# Patient Record
Sex: Male | Born: 1985 | Hispanic: Yes | Marital: Married | Smoking: Former smoker
Health system: Southern US, Community
[De-identification: ages and names within clinical notes are randomized; demographics above are authoritative.]

## PROBLEM LIST (undated history)

## (undated) DIAGNOSIS — N183 Chronic kidney disease, stage 3 unspecified: Secondary | ICD-10-CM

## (undated) DIAGNOSIS — I10 Essential (primary) hypertension: Secondary | ICD-10-CM

## (undated) HISTORY — DX: Essential (primary) hypertension: I10

## (undated) HISTORY — DX: Chronic kidney disease, stage 3 (moderate): N18.3

## (undated) HISTORY — PX: NO PAST SURGERIES: SHX2092

## (undated) HISTORY — DX: Chronic kidney disease, stage 3 unspecified: N18.30

---

## 2016-03-23 DIAGNOSIS — R1031 Right lower quadrant pain: Secondary | ICD-10-CM | POA: Diagnosis not present

## 2016-03-23 DIAGNOSIS — R040 Epistaxis: Secondary | ICD-10-CM | POA: Diagnosis not present

## 2016-03-23 DIAGNOSIS — I10 Essential (primary) hypertension: Secondary | ICD-10-CM | POA: Diagnosis not present

## 2016-03-30 DIAGNOSIS — R040 Epistaxis: Secondary | ICD-10-CM | POA: Diagnosis not present

## 2016-05-18 DIAGNOSIS — J320 Chronic maxillary sinusitis: Secondary | ICD-10-CM | POA: Diagnosis not present

## 2016-05-18 DIAGNOSIS — I10 Essential (primary) hypertension: Secondary | ICD-10-CM | POA: Diagnosis not present

## 2016-10-24 DIAGNOSIS — I10 Essential (primary) hypertension: Secondary | ICD-10-CM | POA: Diagnosis not present

## 2016-10-24 DIAGNOSIS — J069 Acute upper respiratory infection, unspecified: Secondary | ICD-10-CM | POA: Diagnosis not present

## 2016-10-24 DIAGNOSIS — R6889 Other general symptoms and signs: Secondary | ICD-10-CM | POA: Diagnosis not present

## 2016-10-25 DIAGNOSIS — M9905 Segmental and somatic dysfunction of pelvic region: Secondary | ICD-10-CM | POA: Diagnosis not present

## 2016-10-25 DIAGNOSIS — M9903 Segmental and somatic dysfunction of lumbar region: Secondary | ICD-10-CM | POA: Diagnosis not present

## 2016-10-25 DIAGNOSIS — M9902 Segmental and somatic dysfunction of thoracic region: Secondary | ICD-10-CM | POA: Diagnosis not present

## 2016-10-25 DIAGNOSIS — M5137 Other intervertebral disc degeneration, lumbosacral region: Secondary | ICD-10-CM | POA: Diagnosis not present

## 2016-10-29 DIAGNOSIS — M9905 Segmental and somatic dysfunction of pelvic region: Secondary | ICD-10-CM | POA: Diagnosis not present

## 2016-10-29 DIAGNOSIS — M9902 Segmental and somatic dysfunction of thoracic region: Secondary | ICD-10-CM | POA: Diagnosis not present

## 2016-10-29 DIAGNOSIS — M9903 Segmental and somatic dysfunction of lumbar region: Secondary | ICD-10-CM | POA: Diagnosis not present

## 2016-10-29 DIAGNOSIS — M5137 Other intervertebral disc degeneration, lumbosacral region: Secondary | ICD-10-CM | POA: Diagnosis not present

## 2016-10-30 DIAGNOSIS — M5137 Other intervertebral disc degeneration, lumbosacral region: Secondary | ICD-10-CM | POA: Diagnosis not present

## 2016-10-30 DIAGNOSIS — M9903 Segmental and somatic dysfunction of lumbar region: Secondary | ICD-10-CM | POA: Diagnosis not present

## 2016-10-30 DIAGNOSIS — M9905 Segmental and somatic dysfunction of pelvic region: Secondary | ICD-10-CM | POA: Diagnosis not present

## 2016-10-30 DIAGNOSIS — M9902 Segmental and somatic dysfunction of thoracic region: Secondary | ICD-10-CM | POA: Diagnosis not present

## 2016-10-31 DIAGNOSIS — M9902 Segmental and somatic dysfunction of thoracic region: Secondary | ICD-10-CM | POA: Diagnosis not present

## 2016-10-31 DIAGNOSIS — M9905 Segmental and somatic dysfunction of pelvic region: Secondary | ICD-10-CM | POA: Diagnosis not present

## 2016-10-31 DIAGNOSIS — M5137 Other intervertebral disc degeneration, lumbosacral region: Secondary | ICD-10-CM | POA: Diagnosis not present

## 2016-10-31 DIAGNOSIS — M9903 Segmental and somatic dysfunction of lumbar region: Secondary | ICD-10-CM | POA: Diagnosis not present

## 2016-11-02 DIAGNOSIS — J209 Acute bronchitis, unspecified: Secondary | ICD-10-CM | POA: Diagnosis not present

## 2016-11-05 DIAGNOSIS — M9905 Segmental and somatic dysfunction of pelvic region: Secondary | ICD-10-CM | POA: Diagnosis not present

## 2016-11-05 DIAGNOSIS — M5137 Other intervertebral disc degeneration, lumbosacral region: Secondary | ICD-10-CM | POA: Diagnosis not present

## 2016-11-05 DIAGNOSIS — M9902 Segmental and somatic dysfunction of thoracic region: Secondary | ICD-10-CM | POA: Diagnosis not present

## 2016-11-05 DIAGNOSIS — M9903 Segmental and somatic dysfunction of lumbar region: Secondary | ICD-10-CM | POA: Diagnosis not present

## 2016-11-14 DIAGNOSIS — J209 Acute bronchitis, unspecified: Secondary | ICD-10-CM | POA: Diagnosis not present

## 2017-10-21 ENCOUNTER — Ambulatory Visit (INDEPENDENT_AMBULATORY_CARE_PROVIDER_SITE_OTHER): Payer: BLUE CROSS/BLUE SHIELD | Admitting: Family Medicine

## 2017-10-21 ENCOUNTER — Encounter: Payer: Self-pay | Admitting: Family Medicine

## 2017-10-21 VITALS — BP 157/95 | HR 72 | Temp 98.4°F | Resp 18 | Ht 65.0 in | Wt 200.0 lb

## 2017-10-21 DIAGNOSIS — I10 Essential (primary) hypertension: Secondary | ICD-10-CM

## 2017-10-21 DIAGNOSIS — R0683 Snoring: Secondary | ICD-10-CM

## 2017-10-21 MED ORDER — CHLORTHALIDONE 25 MG PO TABS: 25.0000 mg | ORAL_TABLET | Freq: Every day | ORAL | 2 refills | Status: DC

## 2017-10-21 MED FILL — CHLORTHALIDONE 25 MG TAB: 25 | 37 days supply | Qty: 30 | Fill #0

## 2017-10-21 NOTE — Progress Notes (Signed)
Chief Complaint  Patient presents with  . Establish Care       New Patient Visit SUBJECTIVE: HPI: William Long is an 32 y.o.male who is being seen for establishing care.  The patient has not previously had a PCP.   Hypertension Patient presents for hypertension follow up. He does monitor home blood pressures. Blood pressures ranging on average from 130-150's/80-90's. He is not compliant with medications- amlodipine 5 mg/d, HCTZ 25 mg/d. Patient has these side effects of medication: amlodipine may have made him sleepy He is not adhering to a healthy diet overall. Exercise: None; physically active at home +famhx of HTN; unsure about parents, uncle and cousin have it Pt does snore and wakes up feeling poorly rested. Concerned he may have sleep apnea.   Past Medical History:  Diagnosis Date  . Hypertension    History reviewed. No pertinent surgical history. Family History  Problem Relation Age of Onset  . Diabetes Maternal Grandfather    Not on File  Current Outpatient Medications:  .  chlorthalidone (HYGROTON) 25 MG tablet, Take 1 tablet (25 mg total) by mouth daily. Take 1/2 tab daily for the first 2 weeks., Disp: 30 tablet, Rfl: 2  ROS Cardiovascular: Denies chest pain  Respiratory: Denies dyspnea   OBJECTIVE: BP (!) 157/95 (BP Location: Left Arm, Patient Position: Sitting, Cuff Size: Normal)   Pulse 72   Temp 98.4 F (36.9 C) (Oral)   Resp 18   Ht 5\' 5"  (1.651 m)   Wt 200 lb (90.7 kg)   SpO2 100%   BMI 33.28 kg/m   Constitutional: -  VS reviewed -  Well developed, well nourished, appears stated age -  No apparent distress  Psychiatric: -  Oriented to person, place, and time -  Memory intact -  Affect and mood normal -  Fluent conversation, good eye contact -  Judgment and insight age appropriate  Eye: -  Conjunctivae clear, no discharge -  Pupils symmetric, round, reactive to light  ENMT: -  MMM    Pharynx moist, no exudate, no erythema  Neck: -   No gross swelling, no palpable masses -  Thyroid midline, not enlarged, mobile, no palpable masses  Cardiovascular: -  RRR -  No LE edema  Respiratory: -  Normal respiratory effort, no accessory muscle use, no retraction -  Breath sounds equal, no wheezes, no ronchi, no crackles  Musculoskeletal: -  No clubbing, no cyanosis -  Gait normal  Skin: -  No significant lesion on inspection -  Warm and dry to palpation   ASSESSMENT/PLAN: Snoring - Plan: Ambulatory referral to Pulmonology  Essential hypertension - Plan: Basic metabolic panel, Basic metabolic panel  OSA would be rational explanation for his BP at this age. Will set up with sleep team. Counseled on diet and exercise. Healthy diet handout given. Stop amlodipine as it sounds like it gave him more drowsiness. Start chlorthalidone 12.5 mg/d and increase to 25 mg/d after 2 weeks. Check lytes/renal function today and in 1 week after starting. Ck BP at home, write down and bring to next appt. Patient should return in 1 mo for CPE. The patient voiced understanding and agreement to the plan.   [place], DO 10/21/17  2:54 PM

## 2017-10-21 NOTE — Patient Instructions (Addendum)
If you do not hear anything about your referral in the next 1-2 weeks, call our office and ask for an update.  Aim to do some physical exertion for 150 minutes per week. This is typically divided into 5 days per week, 30 minutes per day. The activity should be enough to get your heart rate up. Anything is better than nothing if you have time constraints.  Healthy Eating Plan Many factors influence your heart health, including eating and exercise habits. Heart (coronary) risk increases with abnormal blood fat (lipid) levels. Heart-healthy meal planning includes limiting unhealthy fats, increasing healthy fats, and making other small dietary changes. This includes maintaining a healthy body weight to help keep lipid levels within a normal range.  WHAT IS MY PLAN?  Your health care provider recommends that you:  Drink a glass of water before meals to help with satiety.  Eat slowly.  An alternative to the water is to add Metamucil. This will help with satiety as well. It does contain calories, unlike water.  WHAT TYPES OF FAT SHOULD I CHOOSE?  Choose healthy fats more often. Choose monounsaturated and polyunsaturated fats, such as olive oil and canola oil, flaxseeds, walnuts, almonds, and seeds.  Eat more omega-3 fats. Good choices include salmon, mackerel, sardines, tuna, flaxseed oil, and ground flaxseeds. Aim to eat fish at least two times each week.  Avoid foods with partially hydrogenated oils in them. These contain trans fats. Examples of foods that contain trans fats are stick margarine, some tub margarines, cookies, crackers, and other baked goods. If you are going to avoid a fat, this is the one to avoid!  WHAT GENERAL GUIDELINES DO I NEED TO FOLLOW?  Check food labels carefully to identify foods with trans fats. Avoid these types of options when possible.  Fill one half of your plate with vegetables and green salads. Eat 4-5 servings of vegetables per day. A serving of vegetables  equals 1 cup of raw leafy vegetables,  cup of raw or cooked cut-up vegetables, or  cup of vegetable juice.  Fill one fourth of your plate with whole grains. Look for the word "whole" as the first word in the ingredient list.  Fill one fourth of your plate with lean protein foods.  Eat 4-5 servings of fruit per day. A serving of fruit equals one medium whole fruit,  cup of dried fruit,  cup of fresh, frozen, or canned fruit. Try to avoid fruits in cups/syrups as the sugar content can be high.  Eat more foods that contain soluble fiber. Examples of foods that contain this type of fiber are apples, broccoli, carrots, beans, peas, and barley. Aim to get 20-30 g of fiber per day.  Eat more home-cooked food and less restaurant, buffet, and fast food.  Limit or avoid alcohol.  Limit foods that are high in starch and sugar.  Avoid fried foods when able.  Cook foods by using methods other than frying. Baking, boiling, grilling, and broiling are all great options. Other fat-reducing suggestions include: ? Removing the skin from poultry. ? Removing all visible fats from meats. ? Skimming the fat off of stews, soups, and gravies before serving them. ? Steaming vegetables in water or broth.  Lose weight if you are overweight. Losing just 5-10% of your initial body weight can help your overall health and prevent diseases such as diabetes and heart disease.  Increase your consumption of nuts, legumes, and seeds to 4-5 servings per week. One serving of dried beans or  legumes equals  cup after being cooked, one serving of nuts equals 1 ounces, and one serving of seeds equals  ounce or 1 tablespoon.  WHAT ARE GOOD FOODS CAN I EAT? Grains Grainy breads (try to find bread that is 3 g of fiber per slice or greater), oatmeal, light popcorn. Whole-grain cereals. Rice and pasta, including brown rice and those that are made with whole wheat. Edamame pasta is a great alternative to grain pasta. It has a  higher protein content. Try to avoid significant consumption of white bread, sugary cereals, or pastries/baked goods.  Vegetables All vegetables. Cooked white potatoes do not count as vegetables.  Fruits All fruits, but limit pineapple and bananas as these fruits have a higher sugar content.  Meats and Other Protein Sources Lean, well-trimmed beef, veal, pork, and lamb. Chicken and [place] without skin. All fish and shellfish. Wild duck, rabbit, pheasant, and venison. Egg whites or low-cholesterol egg substitutes. Dried beans, peas, lentils, and tofu.Seeds and most nuts.  Dairy Low-fat or nonfat cheeses, including ricotta, string, and mozzarella. Skim or 1% milk that is liquid, powdered, or evaporated. Buttermilk that is made with low-fat milk. Nonfat or low-fat yogurt. Soy/Almond milk are good alternatives if you cannot handle dairy.  Beverages Water is the best for you. Sports drinks with less sugar are more desirable unless you are a highly active athlete.  Sweets and Desserts Sherbets and fruit ices. Honey, jam, marmalade, jelly, and syrups. Dark chocolate.  Eat all sweets and desserts in moderation.  Fats and Oils Nonhydrogenated (trans-free) margarines. Vegetable oils, including soybean, sesame, sunflower, olive, peanut, safflower, corn, canola, and cottonseed. Salad dressings or mayonnaise that are made with a vegetable oil. Limit added fats and oils that you use for cooking, baking, salads, and as spreads.  Other Cocoa powder. Coffee and tea. Most condiments.  The items listed above may not be a complete list of recommended foods or beverages. Contact your dietitian for more options.

## 2017-10-22 ENCOUNTER — Other Ambulatory Visit: Payer: Self-pay | Admitting: Family Medicine

## 2017-10-22 LAB — BASIC METABOLIC PANEL
BUN: 16 mg/dL (ref 6–23)
CO2: 28 mEq/L (ref 19–32)
Calcium: 9 mg/dL (ref 8.4–10.5)
Chloride: 103 mEq/L (ref 96–112)
Creatinine, Ser: 1.9 mg/dL — ABNORMAL HIGH (ref 0.40–1.50)
GFR: 44.19 mL/min — ABNORMAL LOW (ref >60.00)
Glucose, Bld: 100 mg/dL — ABNORMAL HIGH (ref 70–99)
Potassium: 3.9 mEq/L (ref 3.5–5.1)
Sodium: 138 mEq/L (ref 135–145)

## 2017-10-22 MED ORDER — CARVEDILOL 12.5 MG PO TABS: 12.5000 mg | ORAL_TABLET | Freq: Two times a day (BID) | ORAL | 3 refills | Status: DC

## 2017-10-22 MED FILL — CARVEDILOL 12.5 MG TABLET: 12.5 | 30 days supply | Qty: 60 | Fill #0

## 2017-10-24 ENCOUNTER — Telehealth: Payer: Self-pay | Admitting: Family Medicine

## 2017-10-24 ENCOUNTER — Telehealth: Payer: Self-pay

## 2017-10-24 NOTE — Telephone Encounter (Signed)
Pt given results per notes of Dr Nani Ravens on 10/22/17.Unable to document in result note due to result note not being routed to [facility]. Lab appointment made for 10/31/17 at 0700 for a BMET.

## 2017-10-24 NOTE — Telephone Encounter (Signed)
Pt. Already contacted by [facility] nurse, Corky Sox, regarding Dr. Irene Limbo new orders per 7/11 message.

## 2017-10-24 NOTE — Telephone Encounter (Signed)
-----   Message from Shelda Pal, DO sent at 10/22/2017  1:09 PM EDT ----- Pt's kidney function is not as good as I had hoped it would be. I reviewed his past labs and note that his renal function has been progressively worsening. Will stop chlorthalidone for now and call in Coreg as he had some AE's with amlodipine. I would still like to recheck his labs in 1 week as originally planned, but to make sure his renal function has improved. If not, we will get a urine sample and a more comprehensive medication/supplement hx.  RE- please let pt know to stop his blood pressure medicine as his baseline kidney function is not as good as we thought. Let's keep the lab visit in 1 week though. Stay hydrated and avoid NSAIDs like ibuprofen when able. I have called in a new BP med to replace the chlorthalidone with for now. TY.

## 2017-10-31 ENCOUNTER — Other Ambulatory Visit: Payer: Self-pay | Admitting: Family Medicine

## 2017-10-31 ENCOUNTER — Other Ambulatory Visit (INDEPENDENT_AMBULATORY_CARE_PROVIDER_SITE_OTHER): Payer: BLUE CROSS/BLUE SHIELD

## 2017-10-31 DIAGNOSIS — R7989 Other specified abnormal findings of blood chemistry: Secondary | ICD-10-CM

## 2017-10-31 DIAGNOSIS — I10 Essential (primary) hypertension: Secondary | ICD-10-CM | POA: Diagnosis not present

## 2017-10-31 LAB — BASIC METABOLIC PANEL
BUN: 23 mg/dL (ref 6–23)
CO2: 27 mEq/L (ref 19–32)
Calcium: 8.9 mg/dL (ref 8.4–10.5)
Chloride: 107 mEq/L (ref 96–112)
Creatinine, Ser: 2.14 mg/dL — ABNORMAL HIGH (ref 0.40–1.50)
GFR: 38.52 mL/min — ABNORMAL LOW (ref >60.00)
Glucose, Bld: 103 mg/dL — ABNORMAL HIGH (ref 70–99)
Potassium: 3.8 mEq/L (ref 3.5–5.1)
Sodium: 141 mEq/L (ref 135–145)

## 2017-10-31 NOTE — Progress Notes (Signed)
Labs ordered, if no improvement or answer, will refer to nephro.

## 2017-11-01 ENCOUNTER — Other Ambulatory Visit (INDEPENDENT_AMBULATORY_CARE_PROVIDER_SITE_OTHER): Payer: BLUE CROSS/BLUE SHIELD

## 2017-11-01 DIAGNOSIS — R7989 Other specified abnormal findings of blood chemistry: Secondary | ICD-10-CM | POA: Diagnosis not present

## 2017-11-01 LAB — COMPREHENSIVE METABOLIC PANEL
ALT: 20 U/L (ref 0–53)
AST: 19 U/L (ref 0–37)
Albumin: 3.8 g/dL (ref 3.5–5.2)
Alkaline Phosphatase: 41 U/L (ref 39–117)
BUN: 21 mg/dL (ref 6–23)
CO2: 27 mEq/L (ref 19–32)
Calcium: 9 mg/dL (ref 8.4–10.5)
Chloride: 107 mEq/L (ref 96–112)
Creatinine, Ser: 1.94 mg/dL — ABNORMAL HIGH (ref 0.40–1.50)
GFR: 43.14 mL/min — ABNORMAL LOW (ref >60.00)
Glucose, Bld: 102 mg/dL — ABNORMAL HIGH (ref 70–99)
Potassium: 3.6 mEq/L (ref 3.5–5.1)
Sodium: 140 mEq/L (ref 135–145)
Total Bilirubin: 0.5 mg/dL (ref 0.2–1.2)
Total Protein: 6.4 g/dL (ref 6.0–8.3)

## 2017-11-01 LAB — URINALYSIS, ROUTINE W REFLEX MICROSCOPIC
Bilirubin Urine: NEGATIVE
Ketones, ur: NEGATIVE
Leukocytes, UA: NEGATIVE
Nitrite: NEGATIVE
Specific Gravity, Urine: 1.01 (ref 1.000–1.030)
Total Protein, Urine: 100 — AB
Urine Glucose: NEGATIVE
Urobilinogen, UA: 0.2 (ref 0.0–1.0)
pH: 6 (ref 5.0–8.0)

## 2017-11-01 LAB — MICROALBUMIN / CREATININE URINE RATIO
Creatinine,U: 52.2 mg/dL
Microalb Creat Ratio: 125.2 mg/g — ABNORMAL HIGH (ref 0.0–30.0)
Microalb, Ur: 65.4 mg/dL — ABNORMAL HIGH (ref 0.0–1.9)

## 2017-11-01 LAB — CBC WITH DIFFERENTIAL/PLATELET
Basophils Absolute: 0.1 10*3/uL (ref 0.0–0.1)
Basophils Relative: 1.3 % (ref 0.0–3.0)
Eosinophils Absolute: 0.2 10*3/uL (ref 0.0–0.7)
Eosinophils Relative: 4.4 % (ref 0.0–5.0)
HCT: 42.8 % (ref 39.0–52.0)
Hemoglobin: 14.6 g/dL (ref 13.0–17.0)
Lymphocytes Relative: 32 % (ref 12.0–46.0)
Lymphs Abs: 1.8 10*3/uL (ref 0.7–4.0)
MCHC: 34.1 g/dL (ref 30.0–36.0)
MCV: 84.5 fl (ref 78.0–100.0)
Monocytes Absolute: 0.4 10*3/uL (ref 0.1–1.0)
Monocytes Relative: 6.9 % (ref 3.0–12.0)
Neutro Abs: 3.1 10*3/uL (ref 1.4–7.7)
Neutrophils Relative %: 55.4 % (ref 43.0–77.0)
Platelets: 256 10*3/uL (ref 150.0–400.0)
RBC: 5.06 Mil/uL (ref 4.22–5.81)
RDW: 13.9 % (ref 11.5–15.5)
WBC: 5.6 10*3/uL (ref 4.0–10.5)

## 2017-11-05 ENCOUNTER — Other Ambulatory Visit: Payer: Self-pay | Admitting: Family Medicine

## 2017-11-05 ENCOUNTER — Encounter: Payer: Self-pay | Admitting: Family Medicine

## 2017-11-05 DIAGNOSIS — N183 Chronic kidney disease, stage 3 unspecified: Secondary | ICD-10-CM

## 2017-11-05 DIAGNOSIS — N1832 Chronic kidney disease, stage 3b: Secondary | ICD-10-CM | POA: Insufficient documentation

## 2017-11-05 DIAGNOSIS — R809 Proteinuria, unspecified: Secondary | ICD-10-CM

## 2017-11-20 ENCOUNTER — Encounter: Payer: Self-pay | Admitting: Family Medicine

## 2017-11-20 ENCOUNTER — Ambulatory Visit (INDEPENDENT_AMBULATORY_CARE_PROVIDER_SITE_OTHER): Payer: BLUE CROSS/BLUE SHIELD | Admitting: Family Medicine

## 2017-11-20 VITALS — BP 110/82 | HR 68 | Temp 98.8°F | Ht 65.0 in | Wt 201.0 lb

## 2017-11-20 DIAGNOSIS — I10 Essential (primary) hypertension: Secondary | ICD-10-CM

## 2017-11-20 DIAGNOSIS — N183 Chronic kidney disease, stage 3 unspecified: Secondary | ICD-10-CM

## 2017-11-20 NOTE — Progress Notes (Signed)
Pre visit review using our clinic review tool, if applicable. No additional management support is needed unless otherwise documented below in the visit note. 

## 2017-11-20 NOTE — Progress Notes (Signed)
Chief Complaint  Patient presents with  . Hypertension    Subjective William Long Real is a 32 y.o. male who presents for hypertension follow up. He does not monitor home blood pressures. He is compliant with medication- Coreg 12.5 mg bid. Patient has these side effects of medication: some sleepiness that is getting He is adhering to a healthy diet overall. Current exercise: physically active at work  +elevated creatinine and proteinuria. Has appt with [facility] tomorrow. Wanted to hold off on ACEi/ARB until seeing specialist. He was started on chlorthalidone, but was instructed to stop it after finding our renal function.   Past Medical History:  Diagnosis Date  . CKD (chronic kidney disease) stage 3, GFR 30-59 ml/min (HCC)   . Hypertension     Review of Systems Cardiovascular: no chest pain Respiratory:  no shortness of breath  Exam BP 110/82 (BP Location: Left Arm, Patient Position: Sitting, Cuff Size: Normal)   Pulse 68   Temp 98.8 F (37.1 C) (Oral)   Ht 5\' 5"  (1.651 m)   Wt 201 lb (91.2 kg)   SpO2 97%   BMI 33.45 kg/m  General:  well developed, well nourished, in no apparent distress Heart: RRR, no bruits, no LE edema Lungs: clear to auscultation, no accessory muscle use Psych: well oriented with normal range of affect and appropriate judgment/insight  Essential hypertension  CKD (chronic kidney disease) stage 3, GFR 30-59 ml/min (HCC)  Orders as above. Counseled on diet and exercise. Sees Nephro tomorrow.  F/u in in 3 mo. The patient voiced understanding and agreement to the plan.  [place], DO 11/20/17  8:58 AM

## 2017-11-20 NOTE — Patient Instructions (Addendum)
No changes in blood pressure medications.  Stay active, keep the diet clean.  Check 1-2 times per week your blood pressure and write them down.  Let us know if you need anything.

## 2017-11-21 DIAGNOSIS — N183 Chronic kidney disease, stage 3 (moderate): Secondary | ICD-10-CM | POA: Diagnosis not present

## 2017-11-21 DIAGNOSIS — R809 Proteinuria, unspecified: Secondary | ICD-10-CM | POA: Diagnosis not present

## 2017-11-21 DIAGNOSIS — I129 Hypertensive chronic kidney disease with stage 1 through stage 4 chronic kidney disease, or unspecified chronic kidney disease: Secondary | ICD-10-CM | POA: Diagnosis not present

## 2017-11-23 ENCOUNTER — Other Ambulatory Visit: Payer: Self-pay | Admitting: Internal Medicine

## 2017-11-23 DIAGNOSIS — N183 Chronic kidney disease, stage 3 unspecified: Secondary | ICD-10-CM

## 2017-12-02 MED FILL — CARVEDILOL 12.5 MG TABLET: 12.5 | 30 days supply | Qty: 60 | Fill #1

## 2017-12-03 ENCOUNTER — Other Ambulatory Visit: Payer: BLUE CROSS/BLUE SHIELD

## 2017-12-24 ENCOUNTER — Ambulatory Visit
Admission: RE | Admit: 2017-12-24 | Discharge: 2017-12-24 | Disposition: A | Discharge status: Home / Self Care | Payer: BLUE CROSS/BLUE SHIELD | Source: Ambulatory Visit | Attending: Internal Medicine | Admitting: Internal Medicine

## 2017-12-24 DIAGNOSIS — N183 Chronic kidney disease, stage 3 unspecified: Secondary | ICD-10-CM

## 2017-12-24 DIAGNOSIS — N189 Chronic kidney disease, unspecified: Secondary | ICD-10-CM | POA: Diagnosis not present

## 2017-12-26 ENCOUNTER — Other Ambulatory Visit (HOSPITAL_COMMUNITY): Payer: Self-pay | Admitting: Internal Medicine

## 2017-12-26 DIAGNOSIS — R809 Proteinuria, unspecified: Secondary | ICD-10-CM

## 2017-12-26 DIAGNOSIS — N183 Chronic kidney disease, stage 3 unspecified: Secondary | ICD-10-CM

## 2018-01-01 ENCOUNTER — Other Ambulatory Visit: Payer: Self-pay | Admitting: Physician Assistant

## 2018-01-02 ENCOUNTER — Other Ambulatory Visit: Payer: Self-pay | Admitting: Radiology

## 2018-01-03 ENCOUNTER — Encounter (HOSPITAL_COMMUNITY): Payer: Self-pay

## 2018-01-03 ENCOUNTER — Ambulatory Visit (HOSPITAL_COMMUNITY)
Admission: RE | Admit: 2018-01-03 | Discharge: 2018-01-03 | Disposition: A | Discharge status: Home / Self Care | Payer: BLUE CROSS/BLUE SHIELD | Source: Ambulatory Visit | Attending: Internal Medicine | Admitting: Internal Medicine

## 2018-01-03 DIAGNOSIS — Z87891 Personal history of nicotine dependence: Secondary | ICD-10-CM | POA: Diagnosis not present

## 2018-01-03 DIAGNOSIS — N183 Chronic kidney disease, stage 3 unspecified: Secondary | ICD-10-CM

## 2018-01-03 DIAGNOSIS — N189 Chronic kidney disease, unspecified: Secondary | ICD-10-CM | POA: Diagnosis not present

## 2018-01-03 DIAGNOSIS — R809 Proteinuria, unspecified: Secondary | ICD-10-CM | POA: Diagnosis present

## 2018-01-03 DIAGNOSIS — Z79899 Other long term (current) drug therapy: Secondary | ICD-10-CM | POA: Insufficient documentation

## 2018-01-03 DIAGNOSIS — I129 Hypertensive chronic kidney disease with stage 1 through stage 4 chronic kidney disease, or unspecified chronic kidney disease: Secondary | ICD-10-CM | POA: Insufficient documentation

## 2018-01-03 LAB — CBC
HCT: 46 % (ref 39.0–52.0)
Hemoglobin: 15.7 g/dL (ref 13.0–17.0)
MCH: 28.7 pg (ref 26.0–34.0)
MCHC: 34.1 g/dL (ref 30.0–36.0)
MCV: 84.1 fL (ref 78.0–100.0)
Platelets: 266 10*3/uL (ref 150–400)
RBC: 5.47 MIL/uL (ref 4.22–5.81)
RDW: 12.8 % (ref 11.5–15.5)
WBC: 6.2 10*3/uL (ref 4.0–10.5)

## 2018-01-03 LAB — PROTIME-INR
INR: 1.01
Prothrombin Time: 13.2 seconds (ref 11.4–15.2)

## 2018-01-03 LAB — APTT: aPTT: 36 seconds (ref 24–36)

## 2018-01-03 MED ORDER — LIDOCAINE HCL (PF) 1 % IJ SOLN
INTRAMUSCULAR | Status: AC
  Filled 2018-01-03: qty 30

## 2018-01-03 MED ORDER — GELATIN ABSORBABLE 12-7 MM EX MISC
CUTANEOUS | Status: AC
  Filled 2018-01-03: qty 1

## 2018-01-03 MED ORDER — FENTANYL CITRATE (PF) 100 MCG/2ML IJ SOLN
INTRAMUSCULAR | Status: AC
  Filled 2018-01-03: qty 2

## 2018-01-03 MED ORDER — MIDAZOLAM HCL 2 MG/2ML IJ SOLN
INTRAMUSCULAR | Status: AC | PRN
  Administered 2018-01-03: 1 mg via INTRAVENOUS

## 2018-01-03 MED ORDER — MIDAZOLAM HCL 2 MG/2ML IJ SOLN
INTRAMUSCULAR | Status: AC
  Filled 2018-01-03: qty 2

## 2018-01-03 MED ORDER — FENTANYL CITRATE (PF) 100 MCG/2ML IJ SOLN
INTRAMUSCULAR | Status: AC | PRN
  Administered 2018-01-03: 50 ug via INTRAVENOUS

## 2018-01-03 MED ORDER — SODIUM CHLORIDE 0.9 % IV SOLN: INTRAVENOUS | Status: DC

## 2018-01-03 NOTE — Procedures (Signed)
Interventional Radiology Procedure Note  Procedure: US guided renal biopsy  Complications: None  Estimated Blood Loss: < 10 mL  Findings: 16 G core biopsy x 2 of left renal cortex  Madline Oesterling T. Kathlene Cote, M.D Pager:  [phone]

## 2018-01-03 NOTE — H&P (Signed)
Chief Complaint: Patient was seen in consultation today for random renal biopsy at the request of Krus[place],Lindsay A  Referring Physician(s): Justin Mend  Supervising Physician: Aletta Edouard  Patient Status: [facility] - Out-pt  History of Present Illness: William Long is a 32 y.o. male   Proteinuria Progressive renal dysfunction Rising Creatinine Scheduled for random renal biopsy  Past Medical History:  Diagnosis Date  . CKD (chronic kidney disease) stage 3, GFR 30-59 ml/min (HCC)   . Hypertension     History reviewed. No pertinent surgical history.  Allergies: Patient has no known allergies.  Medications: Prior to Admission medications   Medication Sig Start Date End Date Taking? Authorizing Provider  carvedilol (COREG) 12.5 MG tablet Take 1 tablet (12.5 mg total) by mouth 2 (two) times daily with a meal. 10/22/17  Yes Wendling, Crosby Oyster, DO  sodium chloride (OCEAN) 0.65 % SOLN nasal spray Place 1 spray into both nostrils daily as needed for congestion.   Yes [provider]     Family History  Problem Relation Age of Onset  . Diabetes Maternal Grandfather     Social History   Socioeconomic History  . Marital status: Married    Spouse name: Not on file  . Number of children: Not on file  . Years of education: Not on file  . Highest education level: Not on file  Occupational History  . Not on file  Social Needs  . Financial resource strain: Not hard at all  . Food insecurity:    Worry: Never true    Inability: Never true  . Transportation needs:    Medical: No    Non-medical: No  Tobacco Use  . Smoking status: Former Research scientist (life sciences)  . Smokeless tobacco: Former Network engineer and Sexual Activity  . Alcohol use: Not Currently  . Drug use: Not Currently  . Sexual activity: Yes  Lifestyle  . Physical activity:    Days per week: 0 days    Minutes per session: 0 min  . Stress: Only a little  Relationships  . Social connections:   Talks on phone: Patient refused    Gets together: Patient refused    Attends religious service: Patient refused    Active member of club or organization: Patient refused    Attends meetings of clubs or organizations: Patient refused    Relationship status: Patient refused  Other Topics Concern  . Not on file  Social History Narrative  . Not on file     Review of Systems: A 12 point ROS discussed and pertinent positives are indicated in the HPI above.  All other systems are negative.  Review of Systems  Constitutional: Negative for activity change, appetite change, fatigue and fever.  Respiratory: Negative for cough and shortness of breath.   Cardiovascular: Negative for chest pain.  Gastrointestinal: Negative for abdominal pain.  Genitourinary: Negative for decreased urine volume.  Musculoskeletal: Negative for back pain.  Neurological: Negative for weakness.  Psychiatric/Behavioral: Negative for behavioral problems and confusion.    Vital Signs: BP 130/90   Pulse 67   Temp 98.6 F (37 C) (Oral)   Resp 16   Ht 5\' 5"  (1.651 m)   Wt 200 lb (90.7 kg)   SpO2 100%   BMI 33.28 kg/m   Physical Exam  Constitutional: He is oriented to person, place, and time.  Cardiovascular: Normal rate, regular rhythm and normal heart sounds.  Pulmonary/Chest: Effort normal and breath sounds normal.  Abdominal: Soft. Bowel sounds  are normal.  Musculoskeletal: Normal range of motion.  Neurological: He is alert and oriented to person, place, and time.  Skin: Skin is warm and dry.  Psychiatric: He has a normal mood and affect. His behavior is normal. Judgment and thought content normal.  Vitals reviewed.   Imaging: US Renal  Result Date: 12/24/2017 CLINICAL DATA:  Chronic kidney disease EXAM: RENAL / URINARY TRACT ULTRASOUND COMPLETE COMPARISON:  None. FINDINGS: Right Kidney: Length: 8.5 cm. Increased cortical echogenicity. No hydronephrosis. Left Kidney: Length: 9.1 cm. Increased cortical  echogenicity. No hydronephrosis. Bladder: Appears normal for degree of bladder distention. IMPRESSION: Small appearing echogenic kidneys consistent with medical renal disease and mild atrophy. No hydronephrosis. Electronically Signed   By: Donavan Foil M.D.   On: 12/24/2017 15:26    Labs:  CBC: Recent Labs    11/01/17 0704 01/03/18 0609  WBC 5.6 6.2  HGB 14.6 15.7  HCT 42.8 46.0  PLT 256.0 266    COAGS: No results for input(s): INR, APTT in the last 8760 hours.  BMP: Recent Labs    10/21/17 1459 10/31/17 0720 11/01/17 0704  NA 138 141 140  K 3.9 3.8 3.6  CL 103 107 107  CO2 28 27 27   GLUCOSE 100* 103* 102*  BUN 16 23 21   CALCIUM 9.0 8.9 9.0  CREATININE 1.90* 2.14* 1.94*    LIVER FUNCTION TESTS: Recent Labs    11/01/17 0704  BILITOT 0.5  AST 19  ALT 20  ALKPHOS 41  PROT 6.4  ALBUMIN 3.8    TUMOR MARKERS: No results for input(s): AFPTM, CEA, CA199, CHROMGRNA in the last 8760 hours.  Assessment and Plan:  Proteinuria Progressive renal dysfunction Scheduled for random renal bx Risks and benefits discussed with the patient including, but not limited to bleeding, infection, damage to adjacent structures or low yield requiring additional tests.  All of the patient's questions were answered, patient is agreeable to proceed. Consent signed and in chart.   Thank you for this interesting consult.  I greatly enjoyed [place] and look forward to participating in their care.  A copy of this report was sent to the requesting provider on this date.  Electronically Signed: Lavonia Drafts, PA-C 01/03/2018, 7:05 AM   I spent a total of  30 Minutes   in face to face in clinical consultation, greater than 50% of which was counseling/coordinating care for random renal bx

## 2018-01-03 NOTE — Discharge Instructions (Addendum)

## 2018-01-03 NOTE — Sedation Documentation (Signed)
Bed Rest to start at [place].

## 2018-01-08 MED FILL — CARVEDILOL 12.5 MG TABLET: 12.5 | 30 days supply | Qty: 60 | Fill #2

## 2018-01-16 ENCOUNTER — Encounter (HOSPITAL_COMMUNITY): Payer: Self-pay | Admitting: Internal Medicine

## 2018-01-20 DIAGNOSIS — I129 Hypertensive chronic kidney disease with stage 1 through stage 4 chronic kidney disease, or unspecified chronic kidney disease: Secondary | ICD-10-CM | POA: Diagnosis not present

## 2018-01-20 DIAGNOSIS — R809 Proteinuria, unspecified: Secondary | ICD-10-CM | POA: Diagnosis not present

## 2018-01-20 DIAGNOSIS — N183 Chronic kidney disease, stage 3 (moderate): Secondary | ICD-10-CM | POA: Diagnosis not present

## 2018-01-21 MED FILL — LISINOPRIL 5 MG TABLET: 5 | 30 days supply | Qty: 30 | Fill #0

## 2018-01-23 ENCOUNTER — Encounter (HOSPITAL_COMMUNITY): Payer: Self-pay | Admitting: Internal Medicine

## 2018-02-20 ENCOUNTER — Ambulatory Visit (INDEPENDENT_AMBULATORY_CARE_PROVIDER_SITE_OTHER): Payer: BLUE CROSS/BLUE SHIELD | Admitting: Family Medicine

## 2018-02-20 ENCOUNTER — Encounter: Payer: Self-pay | Admitting: Family Medicine

## 2018-02-20 VITALS — BP 118/80 | HR 64 | Temp 98.6°F | Ht 65.0 in | Wt 199.0 lb

## 2018-02-20 DIAGNOSIS — Z23 Encounter for immunization: Secondary | ICD-10-CM

## 2018-02-20 DIAGNOSIS — I10 Essential (primary) hypertension: Secondary | ICD-10-CM

## 2018-02-20 DIAGNOSIS — R0683 Snoring: Secondary | ICD-10-CM

## 2018-02-20 NOTE — Patient Instructions (Signed)
Aim to do some physical exertion for 150 minutes per week. This is typically divided into 5 days per week, 30 minutes per day. The activity should be enough to get your heart rate up. Anything is better than nothing if you have time constraints.  Try to clean up diet.  Let me know when you would like [place] to set you up with the sleep study.  You can stop the carvedilol.   Let us know if you need anything.

## 2018-02-20 NOTE — Progress Notes (Signed)
Chief Complaint  Patient presents with  . Follow-up    Subjective William Long is a 32 y.o. male who presents for hypertension follow up. He does not monitor home blood pressures. He is compliant with medication- lisinopril 5 mg/d. Patient has these side effects of medication: none He is not adhering to a healthy diet overall. Current exercise: none Does snore at night, falls asleep when he's driving. Did not set up sleep specialist appt due to schedule and is not prepared at this time.   Past Medical History:  Diagnosis Date  . CKD (chronic kidney disease) stage 3, GFR 30-59 ml/min (HCC)   . Hypertension     Review of Systems Cardiovascular: no chest pain Respiratory:  no shortness of breath  Exam BP 118/80 (BP Location: Left Arm, Patient Position: Sitting, Cuff Size: Normal)   Pulse 64   Temp 98.6 F (37 C) (Oral)   Ht 5\' 5"  (1.651 m)   Wt 199 lb (90.3 kg)   SpO2 97%   BMI 33.12 kg/m  General:  well developed, well nourished, in no apparent distress Heart: RRR, no bruits, no LE edema Lungs: clear to auscultation, no accessory muscle use Psych: well oriented with normal range of affect and appropriate judgment/insight  Essential hypertension  Snoring  Cont ACEi. OK to stop bb. He is to let us know when he is ready for the sleep referral. Unable to give tdap today because the refrigerator was left open, will reach out to schedule nurse visit or to go to pharmacy.  Counseled on diet and exercise. F/u in in 6 mo for CPE or prn. The patient voiced understanding and agreement to the plan.  [place], DO 02/20/18  7:15 AM

## 2018-02-20 NOTE — Progress Notes (Signed)
Pre visit review using our clinic review tool, if applicable. No additional management support is needed unless otherwise documented below in the visit note. 

## 2018-02-20 NOTE — Addendum Note (Signed)
Addended by: Sharon Seller B on: 02/20/2018 07:35 AM   Modules accepted: Orders

## 2018-02-25 MED FILL — LISINOPRIL 5 MG TABLET: 5 | 30 days supply | Qty: 30 | Fill #1

## 2018-03-20 MED FILL — LISINOPRIL 5 MG TABLET: 5 | 30 days supply | Qty: 30 | Fill #2

## 2018-03-27 ENCOUNTER — Encounter: Payer: Self-pay | Admitting: Family Medicine

## 2018-03-27 ENCOUNTER — Ambulatory Visit (INDEPENDENT_AMBULATORY_CARE_PROVIDER_SITE_OTHER): Payer: BLUE CROSS/BLUE SHIELD | Admitting: Family Medicine

## 2018-03-27 VITALS — BP 120/80 | HR 85 | Temp 98.7°F | Ht 65.0 in | Wt 199.0 lb

## 2018-03-27 DIAGNOSIS — J40 Bronchitis, not specified as acute or chronic: Secondary | ICD-10-CM

## 2018-03-27 DIAGNOSIS — Z23 Encounter for immunization: Secondary | ICD-10-CM

## 2018-03-27 MED ORDER — METHYLPREDNISOLONE ACETATE 80 MG/ML IJ SUSP
80.0000 mg | Freq: Once | INTRAMUSCULAR | Status: AC
  Administered 2018-03-27: 80 mg via INTRAMUSCULAR

## 2018-03-27 MED FILL — BENZONATATE 100 MG CAP: 100 | 10 days supply | Qty: 30 | Fill #0

## 2018-03-27 MED FILL — AZITHROMYCIN 250 MG TABLET: 250 | 5 days supply | Qty: 6 | Fill #0

## 2018-03-27 NOTE — Patient Instructions (Signed)
Continue to push fluids, practice good hand hygiene, and cover your mouth if you cough.  If you start having fevers, shaking or shortness of breath, seek immediate care.  For symptoms, consider using Vick's VapoRub on chest or under nose, air humidifier, Benadryl at night, and elevating the head of the bed. Tylenol and ibuprofen for aches and pains you may be experiencing.   Don't take antibiotic for another 3-5 days. Only take if you develop a fever, worsen or fail to improve.  Let us know if you need anything.

## 2018-03-27 NOTE — Progress Notes (Signed)
Chief Complaint  Patient presents with  . Cough    [place] here for URI complaints.  Duration: 2 weeks  Associated symptoms: cough, drainage Denies: sinus congestion, sinus pain, rhinorrhea, itchy watery eyes, ear pain, ear drainage, sore throat, wheezing, shortness of breath, myalgia and fevers Treatment to date: Nyquil, Theraflu Sick contacts: No  ROS:  Const: Denies fevers HEENT: As noted in HPI Lungs: +cough  Past Medical History:  Diagnosis Date  . CKD (chronic kidney disease) stage 3, GFR 30-59 ml/min (HCC)   . Hypertension     BP 120/80 (BP Location: Left Arm, Patient Position: Sitting, Cuff Size: Normal)   Pulse 85   Temp 98.7 F (37.1 C) (Oral)   Ht 5\' 5"  (1.651 m)   Wt 199 lb (90.3 kg)   SpO2 98%   BMI 33.12 kg/m  General: Awake, alert, appears stated age HEENT: AT, [place], ears patent b/l and TM's neg, nares patent w/o discharge, pharynx pink and without exudates, MMM Neck: No masses or asymmetry Heart: RRR Lungs: CTAB, no accessory muscle use Psych: Age appropriate judgment and insight, normal mood and affect  Bronchitis - Plan: methylPREDNISolone acetate (DEPO-MEDROL) injection 80 mg  Need for tetanus booster - Plan: Tdap vaccine greater than or equal to 7yo IM  Tessalon Perles q 8 hrs prn cough, Zpak if no improvement.  Continue to push fluids, practice good hand hygiene, cover mouth when coughing. F/u prn. If starting to experience fevers, shaking, or shortness of breath, seek immediate care. Pt voiced understanding and agreement to the plan.  [place], DO 03/27/18 8:01 AM

## 2018-03-27 NOTE — Progress Notes (Signed)
Pre visit review using our clinic review tool, if applicable. No additional management support is needed unless otherwise documented below in the visit note. 

## 2018-05-07 MED FILL — LISINOPRIL 5 MG TABLET: 5 | 30 days supply | Qty: 30 | Fill #0

## 2018-07-04 MED FILL — LISINOPRIL 5 MG TABLET: 5 | 30 days supply | Qty: 30 | Fill #1

## 2018-08-18 MED FILL — LISINOPRIL 5 MG TABLET: 5 | 30 days supply | Qty: 30 | Fill #2

## 2018-08-19 ENCOUNTER — Other Ambulatory Visit: Payer: Self-pay

## 2018-08-19 ENCOUNTER — Ambulatory Visit (INDEPENDENT_AMBULATORY_CARE_PROVIDER_SITE_OTHER): Payer: HRSA Program | Admitting: Family Medicine

## 2018-08-19 ENCOUNTER — Encounter: Payer: Self-pay | Admitting: Family Medicine

## 2018-08-19 DIAGNOSIS — Z20822 Contact with and (suspected) exposure to covid-19: Secondary | ICD-10-CM

## 2018-08-19 DIAGNOSIS — Z20828 Contact with and (suspected) exposure to other viral communicable diseases: Secondary | ICD-10-CM | POA: Diagnosis not present

## 2018-08-19 DIAGNOSIS — R05 Cough: Secondary | ICD-10-CM | POA: Diagnosis not present

## 2018-08-19 NOTE — Progress Notes (Signed)
Chief Complaint  Patient presents with  . Covid exposure needs work note    Subjective: Patient is a 33 y.o. male here for exposure to covid. Due to COVID-19 pandemic, we are interacting via web portal for an electronic face-to-face visit. I verified patient's ID using 2 identifiers. Patient agreed to proceed with visit via this method. Patient is at home, I am at office. Patient and I are present for visit.   Pt was exposed to COVID-19 and needs a letter for work. He is going to pay for out of pocket testing in [place], [place]. It takes 3 d to get results. He has had a cough for 3 mo that was thought to be related to the ACEi he is on. He does not wish to change for now. No fevers, sob, myalgias, GI s/s's.   ROS: Const: No fevers  Past Medical History:  Diagnosis Date  . CKD (chronic kidney disease) stage 3, GFR 30-59 ml/min (HCC)   . Hypertension     Objective: No conversational dyspnea Age appropriate judgment and insight Nml affect and mood  Assessment and Plan: Exposure to Covid-19 Virus  Will write letter. Offered to chance ACEi again, but he declined. Cont self-quarantine. The patient voiced understanding and agreement to the plan.  [place], DO 08/19/18  11:18 AM

## 2018-08-23 DIAGNOSIS — W208XXA Other cause of strike by thrown, projected or falling object, initial encounter: Secondary | ICD-10-CM | POA: Diagnosis not present

## 2018-08-23 DIAGNOSIS — Y999 Unspecified external cause status: Secondary | ICD-10-CM | POA: Diagnosis not present

## 2018-08-23 DIAGNOSIS — S91202A Unspecified open wound of left great toe with damage to nail, initial encounter: Secondary | ICD-10-CM | POA: Diagnosis not present

## 2018-08-23 DIAGNOSIS — Y92009 Unspecified place in unspecified non-institutional (private) residence as the place of occurrence of the external cause: Secondary | ICD-10-CM | POA: Diagnosis not present

## 2019-01-08 MED FILL — LISINOPRIL 5 MG TABLET: 5 | 30 days supply | Qty: 30 | Fill #3

## 2019-01-12 ENCOUNTER — Other Ambulatory Visit: Payer: Self-pay

## 2019-01-13 ENCOUNTER — Encounter: Payer: Self-pay | Admitting: Family Medicine

## 2019-01-13 ENCOUNTER — Ambulatory Visit (INDEPENDENT_AMBULATORY_CARE_PROVIDER_SITE_OTHER): Payer: BC Managed Care – PPO | Admitting: Family Medicine

## 2019-01-13 ENCOUNTER — Other Ambulatory Visit: Payer: Self-pay

## 2019-01-13 VITALS — BP 112/67 | HR 74 | Temp 96.9°F | Ht 65.0 in | Wt 213.5 lb

## 2019-01-13 DIAGNOSIS — N183 Chronic kidney disease, stage 3 unspecified: Secondary | ICD-10-CM

## 2019-01-13 DIAGNOSIS — Z0000 Encounter for general adult medical examination without abnormal findings: Secondary | ICD-10-CM | POA: Diagnosis not present

## 2019-01-13 DIAGNOSIS — Z114 Encounter for screening for human immunodeficiency virus [HIV]: Secondary | ICD-10-CM

## 2019-01-13 DIAGNOSIS — Z23 Encounter for immunization: Secondary | ICD-10-CM | POA: Diagnosis not present

## 2019-01-13 DIAGNOSIS — I10 Essential (primary) hypertension: Secondary | ICD-10-CM

## 2019-01-13 LAB — LIPID PANEL
Cholesterol: 264 mg/dL — ABNORMAL HIGH (ref 0–200)
HDL: 42 mg/dL (ref >39.00)
LDL Cholesterol: 188 mg/dL — ABNORMAL HIGH (ref 0–99)
NonHDL: 221.61
Total CHOL/HDL Ratio: 6
Triglycerides: 166 mg/dL — ABNORMAL HIGH (ref 0.0–149.0)
VLDL: 33.2 mg/dL (ref 0.0–40.0)

## 2019-01-13 LAB — COMPREHENSIVE METABOLIC PANEL
ALT: 24 U/L (ref 0–53)
AST: 20 U/L (ref 0–37)
Albumin: 4 g/dL (ref 3.5–5.2)
Alkaline Phosphatase: 43 U/L (ref 39–117)
BUN: 25 mg/dL — ABNORMAL HIGH (ref 6–23)
CO2: 25 mEq/L (ref 19–32)
Calcium: 9.1 mg/dL (ref 8.4–10.5)
Chloride: 106 mEq/L (ref 96–112)
Creatinine, Ser: 2.28 mg/dL — ABNORMAL HIGH (ref 0.40–1.50)
GFR: 33.43 mL/min — ABNORMAL LOW (ref >60.00)
Glucose, Bld: 100 mg/dL — ABNORMAL HIGH (ref 70–99)
Potassium: 4.3 mEq/L (ref 3.5–5.1)
Sodium: 139 mEq/L (ref 135–145)
Total Bilirubin: 0.5 mg/dL (ref 0.2–1.2)
Total Protein: 6.4 g/dL (ref 6.0–8.3)

## 2019-01-13 LAB — CBC
HCT: 46 % (ref 39.0–52.0)
Hemoglobin: 15.5 g/dL (ref 13.0–17.0)
MCHC: 33.7 g/dL (ref 30.0–36.0)
MCV: 84.3 fl (ref 78.0–100.0)
Platelets: 277 10*3/uL (ref 150.0–400.0)
RBC: 5.46 Mil/uL (ref 4.22–5.81)
RDW: 13.5 % (ref 11.5–15.5)
WBC: 5.9 10*3/uL (ref 4.0–10.5)

## 2019-01-13 NOTE — Progress Notes (Signed)
Chief Complaint  Patient presents with  . Follow-up    Well Male William Long is here for a complete physical.   His last physical was >1 year ago.  Current diet: in general, diet could be better   Current exercise: cycling Weight trend: has increased a little Daytime fatigue? Yes Seat belt? Yes.    Health maintenance Tetanus- Yes HIV- Yes  Past Medical History:  Diagnosis Date  . CKD (chronic kidney disease) stage 3, GFR 30-59 ml/min (HCC)   . Hypertension      History reviewed. No pertinent surgical history.  Medications  Current Outpatient Medications on File Prior to Visit  Medication Sig Dispense Refill  . lisinopril (PRINIVIL,ZESTRIL) 5 MG tablet Take 1 tablet (5 mg total) by mouth daily. 90 tablet 3  . sodium chloride (OCEAN) 0.65 % SOLN nasal spray Place 1 spray into both nostrils daily as needed for congestion.     Allergies No Known Allergies  Family History Family History  Problem Relation Age of Onset  . Diabetes Maternal Grandfather     Review of Systems: Constitutional: no fevers or chills Eye:  no recent significant change in vision Ear/Nose/Mouth/Throat:  Ears:  no tinnitus or hearing loss Nose/Mouth/Throat:  no complaints of nasal congestion, no sore throat Cardiovascular:  no chest pain, no palpitations Respiratory:  no cough and no shortness of breath Gastrointestinal:  no abdominal pain, no change in bowel habits GU:  Male: negative for dysuria, frequency, and incontinence and negative for prostate symptoms Musculoskeletal/Extremities:  no pain, redness, or swelling of the joints Integumentary (Skin/Breast):  no abnormal skin lesions reported Neurologic:  no headaches, no numbness, tingling Endocrine: No unexpected weight changes Hematologic/Lymphatic:  no night sweats  Exam BP 112/67 (BP Location: Right Arm, Patient Position: Sitting, Cuff Size: Normal)   Pulse 74   Temp (!) 96.9 F (36.1 C) (Temporal)   Ht 5\' 5"  (1.651 m)   Wt  213 lb 8 oz (96.8 kg)   SpO2 96%   BMI 35.53 kg/m  General:  well developed, well nourished, in no apparent distress Skin:  no significant moles, warts, or growths Head:  no masses, lesions, or tenderness Eyes:  pupils equal and round, sclera anicteric without injection Ears:  canals without lesions, TMs shiny without retraction, no obvious effusion, no erythema Nose:  nares patent, septum midline, mucosa normal Throat/Pharynx:  lips and gingiva without lesion; tongue and uvula midline; non-inflamed pharynx; no exudates or postnasal drainage Neck: neck supple without adenopathy, thyromegaly, or masses Lungs:  clear to auscultation, breath sounds equal bilaterally, no respiratory distress Cardio:  regular rate and rhythm, no bruits, no LE edema Abdomen:  abdomen soft, nontender; bowel sounds normal; no masses or organomegaly Genital (male): circumcised penis, no lesions or discharge; testes present bilaterally without masses or tenderness Rectal: Deferred Musculoskeletal:  symmetrical muscle groups noted without atrophy or deformity Extremities:  no clubbing, cyanosis, or edema, no deformities, no skin discoloration Neuro:  gait normal; deep tendon reflexes normal and symmetric Psych: well oriented with normal range of affect and appropriate judgment/insight  Assessment and Plan  Well adult exam - Plan: CBC, Lipid panel, Comprehensive metabolic panel  Essential hypertension  CKD (chronic kidney disease) stage 3, GFR 30-59 ml/min  Screening for HIV (human immunodeficiency virus) - Plan: HIV Antibody (routine testing w rflx)   Well 33 y.o. male. Counseled on diet and exercise. Self testicular exams rec'd.  Other orders as above. Follow up in 1 year pending the above  workup. The patient voiced understanding and agreement to the plan.  [place], DO 01/13/19 7:12 AM

## 2019-01-13 NOTE — Addendum Note (Signed)
Addended by: Sharon Seller B on: 01/13/2019 07:23 AM   Modules accepted: Orders

## 2019-01-13 NOTE — Patient Instructions (Addendum)
Keep the diet clean and stay active.  Give us 2-3 business days to get the results of your labs back.   Do monthly self testicular checks in the shower. You are feeling for lumps/bumps that don't belong. If you feel anything like this, let me know!  Let us know if you need anything. 

## 2019-01-14 ENCOUNTER — Other Ambulatory Visit: Payer: Self-pay | Admitting: Family Medicine

## 2019-01-14 DIAGNOSIS — E785 Hyperlipidemia, unspecified: Secondary | ICD-10-CM

## 2019-01-14 LAB — HIV ANTIBODY (ROUTINE TESTING W REFLEX): HIV 1&2 Ab, 4th Generation: NONREACTIVE

## 2019-01-14 NOTE — Progress Notes (Signed)
lipi

## 2019-01-16 ENCOUNTER — Encounter: Payer: Self-pay | Admitting: Family Medicine

## 2019-02-10 MED FILL — LISINOPRIL 5 MG TABLET: 5 | 30 days supply | Qty: 30 | Fill #4

## 2019-02-15 DIAGNOSIS — U071 COVID-19: Secondary | ICD-10-CM | POA: Diagnosis not present

## 2019-04-02 DIAGNOSIS — M9903 Segmental and somatic dysfunction of lumbar region: Secondary | ICD-10-CM | POA: Diagnosis not present

## 2019-05-13 ENCOUNTER — Other Ambulatory Visit: Payer: Self-pay | Admitting: Family Medicine

## 2019-05-13 MED FILL — LISINOPRIL 5 MG TABLET: 5 | 30 days supply | Qty: 30 | Fill #0

## 2019-06-15 MED FILL — LISINOPRIL 5 MG TABLET: 5 | 30 days supply | Qty: 30 | Fill #1

## 2019-07-14 ENCOUNTER — Other Ambulatory Visit: Payer: Self-pay

## 2019-07-14 ENCOUNTER — Ambulatory Visit: Payer: BC Managed Care – PPO | Admitting: Family Medicine

## 2019-07-14 ENCOUNTER — Encounter: Payer: Self-pay | Admitting: Family Medicine

## 2019-07-14 VITALS — BP 120/84 | HR 65 | Temp 97.1°F | Ht 65.0 in | Wt 212.2 lb

## 2019-07-14 DIAGNOSIS — J302 Other seasonal allergic rhinitis: Secondary | ICD-10-CM | POA: Insufficient documentation

## 2019-07-14 DIAGNOSIS — I10 Essential (primary) hypertension: Secondary | ICD-10-CM

## 2019-07-14 MED ORDER — FLUTICASONE PROPIONATE 50 MCG/ACT NA SUSP: 2.0000 | Freq: Every day | NASAL | 2 refills | Status: DC

## 2019-07-14 MED ORDER — LISINOPRIL 5 MG PO TABS: 5.0000 mg | ORAL_TABLET | Freq: Every day | ORAL | 2 refills | Status: DC

## 2019-07-14 NOTE — Patient Instructions (Signed)
Claritin (loratadine), Allegra (fexofenadine), Zyrtec (cetirizine) which is also equivalent to Xyzal (levocetirizine); these are listed in order from weakest to strongest. Generic, and therefore cheaper, options are in the parentheses.   Flonase (fluticasone); nasal spray that is over the counter. 2 sprays each nostril, once daily. Aim towards the same side eye when you spray.  There are available OTC, and the generic versions, which may be cheaper, are in parentheses. Show this to a pharmacist if you have trouble finding any of these items.  Keep the diet clean and stay active.  Let us know if you need anything.

## 2019-07-14 NOTE — Progress Notes (Signed)
Chief Complaint  Patient presents with  . Follow-up  . Allergies    Subjective William Long is a 34 y.o. male who presents for hypertension follow up. He does not monitor home blood pressures. He is compliant with medication- lisinopril 5 mg/d. Patient has these side effects of medication: none He is sometimes adhering to a healthy diet overall. Current exercise: none  Hx of allergies. Taking Zyrtec with some relief. No INCS. +sneezing, itchy/watery eyes, ear fullness, itchy throat, nasal congestion, runny nose. No fevers, sob, wheezing.    Past Medical History:  Diagnosis Date  . CKD (chronic kidney disease) stage 3, GFR 30-59 ml/min   . Hypertension     Review of Systems Cardiovascular: no chest pain Respiratory:  no shortness of breath  Exam BP 120/84 (BP Location: Right Arm, Patient Position: Sitting, Cuff Size: Normal)   Pulse 65   Temp (!) 97.1 F (36.2 C) (Temporal)   Ht 5\' 5"  (1.651 m)   Wt 212 lb 4 oz (96.3 kg)   SpO2 97%   BMI 35.32 kg/m  General:  well developed, well nourished, in no apparent distress Heart: RRR, no bruits, no LE edema Lungs: clear to auscultation, no accessory muscle use Eyes: PERRLA, sclera white Nose: Patent, +turb edema, no d/c Ears: Neg Psych: well oriented with normal range of affect and appropriate judgment/insight  Essential hypertension - Plan: Basic metabolic panel  Seasonal allergies - Plan: fluticasone (FLONASE) 50 MCG/ACT nasal spray  1- Cont lisinopril 5 mg/d for HTn and renal protection. Come back for labs. Counseled on diet and exercise. 2- Cont Zyrtec. Start INCS.  F/u in 6 mo for CPE or prn. The patient voiced understanding and agreement to the plan.  [place], DO 07/14/19  3:08 PM

## 2019-07-31 DIAGNOSIS — Z1152 Encounter for screening for COVID-19: Secondary | ICD-10-CM | POA: Diagnosis not present

## 2019-07-31 DIAGNOSIS — U071 COVID-19: Secondary | ICD-10-CM | POA: Diagnosis not present

## 2019-08-12 MED FILL — LISINOPRIL 5 MG TABLET: 5 | 80 days supply | Qty: 80 | Fill #0

## 2019-09-17 ENCOUNTER — Telehealth: Payer: Self-pay | Admitting: Family Medicine

## 2019-09-17 NOTE — Telephone Encounter (Signed)
Patient states that he needs a note clearing him to work. 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 Please advise

## 2019-09-22 ENCOUNTER — Ambulatory Visit: Payer: BC Managed Care – PPO | Admitting: Family Medicine

## 2019-09-22 ENCOUNTER — Encounter: Payer: Self-pay | Admitting: Family Medicine

## 2019-09-22 ENCOUNTER — Other Ambulatory Visit: Payer: Self-pay

## 2019-09-22 VITALS — BP 122/80 | HR 83 | Temp 96.0°F | Ht 65.0 in | Wt 204.5 lb

## 2019-09-22 DIAGNOSIS — N1832 Chronic kidney disease, stage 3b: Secondary | ICD-10-CM | POA: Diagnosis not present

## 2019-09-22 DIAGNOSIS — E7800 Pure hypercholesterolemia, unspecified: Secondary | ICD-10-CM | POA: Diagnosis not present

## 2019-09-22 NOTE — Progress Notes (Signed)
Chief Complaint  Patient presents with  . Follow-up    paperwork    Subjective: Patient is a 34 y.o. male here for discussion of paperwork.  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 Other positions are in the field where there is potential to be exposed to chemicals that would serve as nephrotoxins. They did a physical on him and found his GFR in the 30's, which is near his baseline. He needs a letter stating that he won't be at risk for worsening kidney function by working there.  Hx of high cholesterol. He was lost to follow up in the fall of 2020. LDL was 180 on most recent employee exam. Thinks he can improve things with his diet/exercise. No CP or SOB.   Past Medical History:  Diagnosis Date  . CKD (chronic kidney disease) stage 3, GFR 30-59 ml/min   . Hypertension     Objective: BP 122/80 (BP Location: Left Arm, Patient Position: Sitting, Cuff Size: Normal)   Pulse 83   Temp (!) 96 F (35.6 C) (Temporal)   Ht 5\' 5"  (1.651 m)   Wt 204 lb 8 oz (92.8 kg)   SpO2 99%   BMI 34.03 kg/m  General: Awake, appears stated age Lungs: No accessory muscle use Psych: Age appropriate judgment and insight, normal affect and mood  Assessment and Plan: Pure hypercholesterolemia - Plan: Lipid panel  Stage 3b chronic kidney disease - Plan: Basic metabolic panel  1- Offered statin. He declined. Will reck in 6 weeks, if still elevated will start statin.  2- Get back in touch with nephro. As he will be in the office away from chemicals, I did feel comfortable writing a letter stating his employment poses no risk to worsening kidney function. F/u pending lab results.  The patient voiced understanding and agreement to the plan.  [place], DO 09/22/19  2:21 PM

## 2019-09-22 NOTE — Patient Instructions (Signed)
We will have you come back in 6 weeks for labs. Be fasting. We might have to start you on a medication depending on the results. I suspect a genetic cause for the high level.  Keep the diet clean and stay active.  Reach out to your kidney specialist and inquire about a change in main office location due to convenience.  Let us know if you need anything.

## 2019-09-28 ENCOUNTER — Ambulatory Visit: Payer: BC Managed Care – PPO | Admitting: Family Medicine

## 2019-11-04 ENCOUNTER — Other Ambulatory Visit: Payer: BC Managed Care – PPO

## 2020-07-11 IMAGING — US US BIOPSY
1 series · 10 of 10 positions shown · non-contrast
Comparison: none

CLINICAL DATA: Worsening chronic kidney disease, hypertension and
need for renal biopsy.

[Series 1: us biopsy · 0.22mm/px · 10 of 10 slices shown]
[im 1/10]
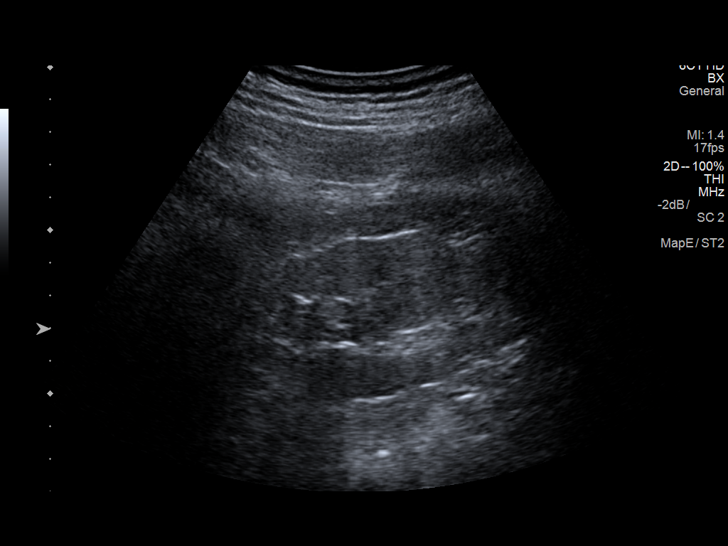
[im 2/10]
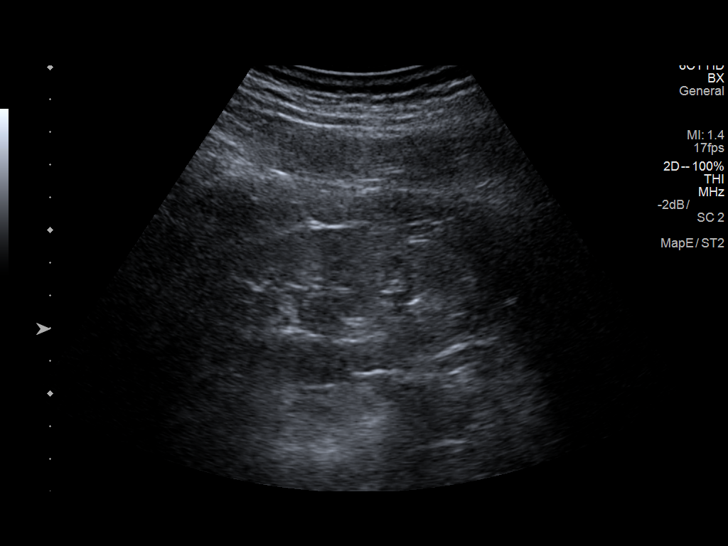
[im 3/10]
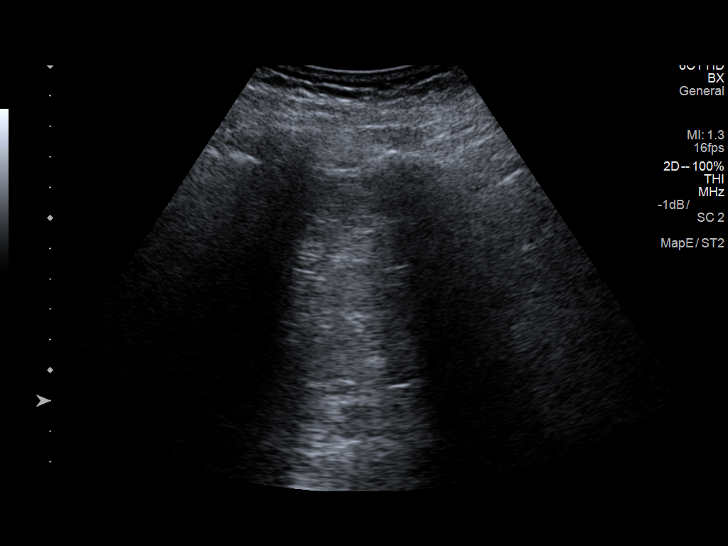
[im 4/10]
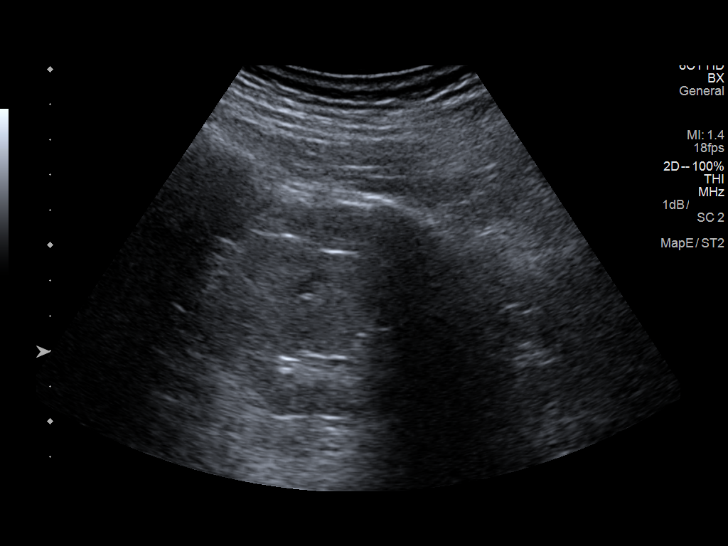
[im 5/10]
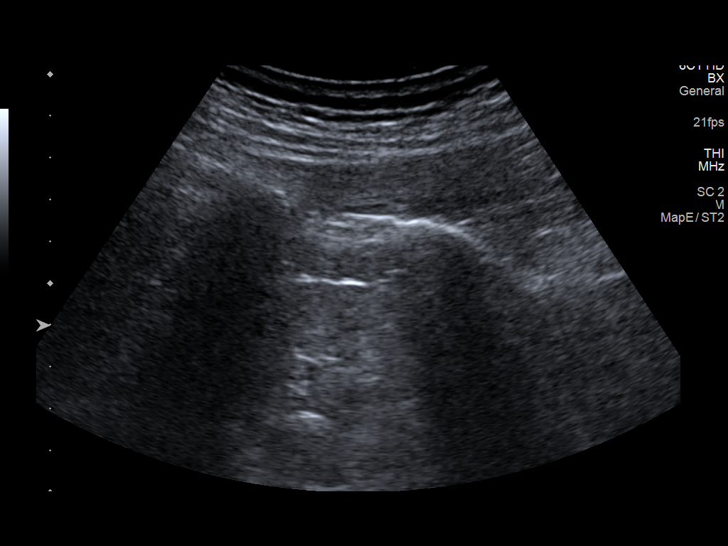
[im 6/10]
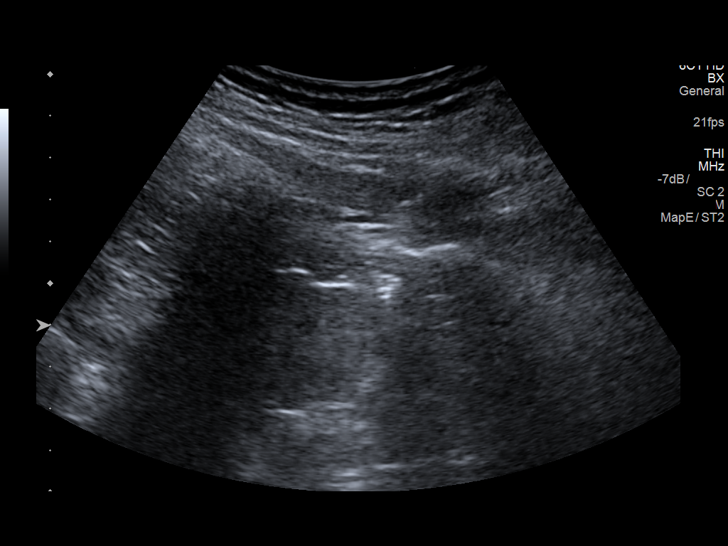
[im 7/10]
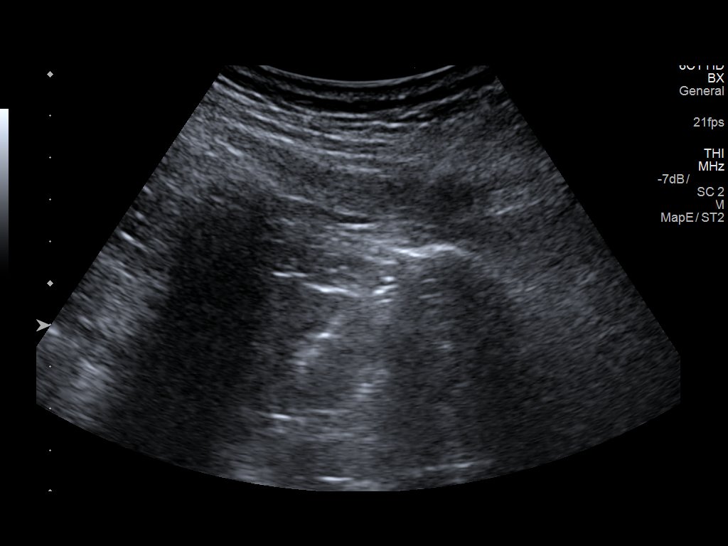
[im 8/10]
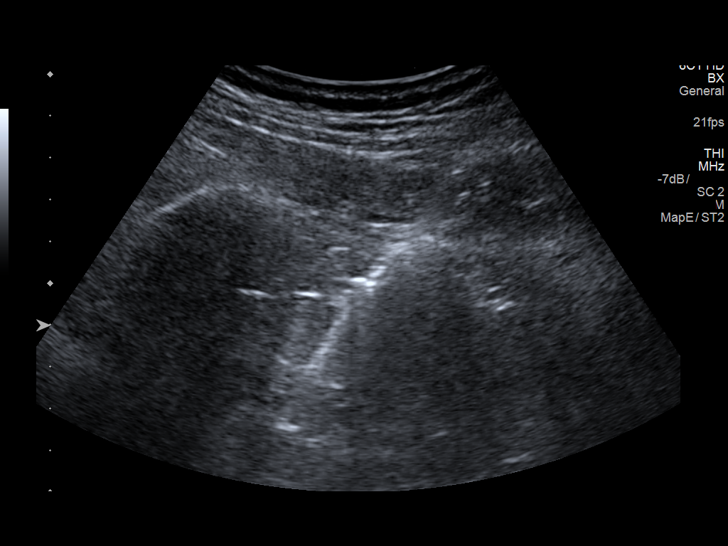
[im 9/10]
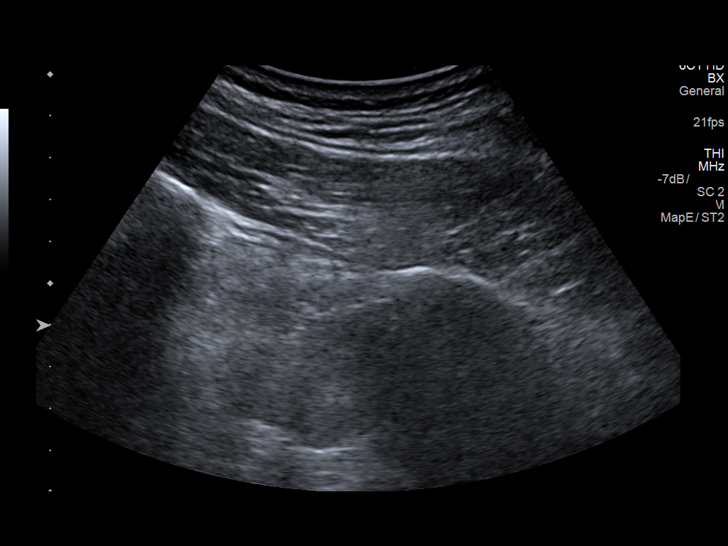
[im 10/10]
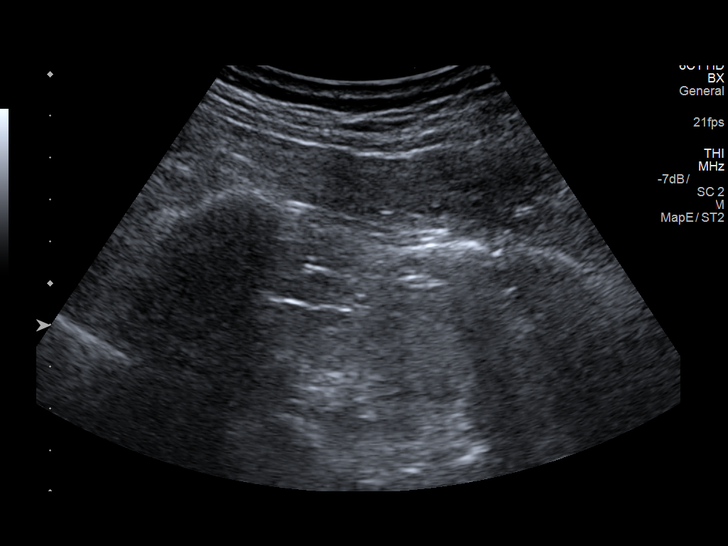

[10 of 10 positions shown; findings below may reference images not displayed]

EXAM:
ULTRASOUND GUIDED CORE BIOPSY OF LEFT KIDNEY

MEDICATIONS:
1.0 mg IV Versed; 50 mcg IV Fentanyl

Total Moderate Sedation Time: 13 minutes.

The patient's level of consciousness and physiologic status were
continuously monitored during the procedure by Radiology nursing.

PROCEDURE:
The procedure, risks, benefits, and alternatives were explained to
the patient. Questions regarding the procedure were encouraged and
answered. The patient understands and consents to the procedure. A
time out was performed prior to initiating the procedure.

Both kidneys were imaged by ultrasound. The left was chosen for
biopsy. The left flank region was prepped with chlorhexidine in a
sterile fashion, and a sterile drape was applied covering the
operative field. A sterile gown and sterile gloves were used for the
procedure. Local anesthesia was provided with 1% Lidocaine.

Under ultrasound guidance, 2 separate 16 gauge core biopsy samples
were obtained of the left kidney at the level of lower pole renal
cortex. Additional ultrasound was performed after biopsy.

COMPLICATIONS:
None.
FINDINGS: Solid tissue was obtained from the left kidney. There was no
evidence of hemorrhage immediately post biopsy by ultrasound.
IMPRESSION: Ultrasound-guided core biopsy performed of the left kidney at the
level of lower pole cortex.

## 2020-08-26 DIAGNOSIS — M9902 Segmental and somatic dysfunction of thoracic region: Secondary | ICD-10-CM | POA: Diagnosis not present

## 2020-08-26 DIAGNOSIS — M5127 Other intervertebral disc displacement, lumbosacral region: Secondary | ICD-10-CM | POA: Diagnosis not present

## 2020-08-26 DIAGNOSIS — M9905 Segmental and somatic dysfunction of pelvic region: Secondary | ICD-10-CM | POA: Diagnosis not present

## 2020-08-26 DIAGNOSIS — M9903 Segmental and somatic dysfunction of lumbar region: Secondary | ICD-10-CM | POA: Diagnosis not present

## 2020-08-29 DIAGNOSIS — M9905 Segmental and somatic dysfunction of pelvic region: Secondary | ICD-10-CM | POA: Diagnosis not present

## 2020-08-29 DIAGNOSIS — M9903 Segmental and somatic dysfunction of lumbar region: Secondary | ICD-10-CM | POA: Diagnosis not present

## 2020-08-29 DIAGNOSIS — M9902 Segmental and somatic dysfunction of thoracic region: Secondary | ICD-10-CM | POA: Diagnosis not present

## 2020-08-29 DIAGNOSIS — M5127 Other intervertebral disc displacement, lumbosacral region: Secondary | ICD-10-CM | POA: Diagnosis not present

## 2020-09-01 DIAGNOSIS — M5127 Other intervertebral disc displacement, lumbosacral region: Secondary | ICD-10-CM | POA: Diagnosis not present

## 2020-09-01 DIAGNOSIS — M9902 Segmental and somatic dysfunction of thoracic region: Secondary | ICD-10-CM | POA: Diagnosis not present

## 2020-09-01 DIAGNOSIS — M9903 Segmental and somatic dysfunction of lumbar region: Secondary | ICD-10-CM | POA: Diagnosis not present

## 2020-09-01 DIAGNOSIS — M9905 Segmental and somatic dysfunction of pelvic region: Secondary | ICD-10-CM | POA: Diagnosis not present

## 2020-09-05 DIAGNOSIS — M5127 Other intervertebral disc displacement, lumbosacral region: Secondary | ICD-10-CM | POA: Diagnosis not present

## 2020-09-05 DIAGNOSIS — M9903 Segmental and somatic dysfunction of lumbar region: Secondary | ICD-10-CM | POA: Diagnosis not present

## 2020-09-05 DIAGNOSIS — M9905 Segmental and somatic dysfunction of pelvic region: Secondary | ICD-10-CM | POA: Diagnosis not present

## 2020-09-05 DIAGNOSIS — M9902 Segmental and somatic dysfunction of thoracic region: Secondary | ICD-10-CM | POA: Diagnosis not present

## 2020-09-08 DIAGNOSIS — M9902 Segmental and somatic dysfunction of thoracic region: Secondary | ICD-10-CM | POA: Diagnosis not present

## 2020-09-08 DIAGNOSIS — M5127 Other intervertebral disc displacement, lumbosacral region: Secondary | ICD-10-CM | POA: Diagnosis not present

## 2020-09-08 DIAGNOSIS — M9903 Segmental and somatic dysfunction of lumbar region: Secondary | ICD-10-CM | POA: Diagnosis not present

## 2020-09-08 DIAGNOSIS — M9905 Segmental and somatic dysfunction of pelvic region: Secondary | ICD-10-CM | POA: Diagnosis not present

## 2020-09-22 DIAGNOSIS — M9905 Segmental and somatic dysfunction of pelvic region: Secondary | ICD-10-CM | POA: Diagnosis not present

## 2020-09-22 DIAGNOSIS — M5127 Other intervertebral disc displacement, lumbosacral region: Secondary | ICD-10-CM | POA: Diagnosis not present

## 2020-09-22 DIAGNOSIS — M9902 Segmental and somatic dysfunction of thoracic region: Secondary | ICD-10-CM | POA: Diagnosis not present

## 2020-09-22 DIAGNOSIS — M9903 Segmental and somatic dysfunction of lumbar region: Secondary | ICD-10-CM | POA: Diagnosis not present

## 2020-10-06 ENCOUNTER — Emergency Department (HOSPITAL_BASED_OUTPATIENT_CLINIC_OR_DEPARTMENT_OTHER): Payer: Self-pay

## 2020-10-06 ENCOUNTER — Other Ambulatory Visit: Payer: Self-pay

## 2020-10-06 ENCOUNTER — Emergency Department (HOSPITAL_BASED_OUTPATIENT_CLINIC_OR_DEPARTMENT_OTHER)
Admission: EM | Admit: 2020-10-06 | Discharge: 2020-10-06 | Disposition: A | Discharge status: Home / Self Care | Payer: Self-pay | Attending: Emergency Medicine | Admitting: Emergency Medicine

## 2020-10-06 ENCOUNTER — Encounter (HOSPITAL_BASED_OUTPATIENT_CLINIC_OR_DEPARTMENT_OTHER): Payer: Self-pay | Admitting: *Deleted

## 2020-10-06 ENCOUNTER — Other Ambulatory Visit (HOSPITAL_BASED_OUTPATIENT_CLINIC_OR_DEPARTMENT_OTHER): Payer: Self-pay

## 2020-10-06 DIAGNOSIS — R059 Cough, unspecified: Secondary | ICD-10-CM

## 2020-10-06 DIAGNOSIS — B349 Viral infection, unspecified: Secondary | ICD-10-CM | POA: Insufficient documentation

## 2020-10-06 DIAGNOSIS — I129 Hypertensive chronic kidney disease with stage 1 through stage 4 chronic kidney disease, or unspecified chronic kidney disease: Secondary | ICD-10-CM | POA: Insufficient documentation

## 2020-10-06 DIAGNOSIS — J029 Acute pharyngitis, unspecified: Secondary | ICD-10-CM | POA: Insufficient documentation

## 2020-10-06 DIAGNOSIS — Z20822 Contact with and (suspected) exposure to covid-19: Secondary | ICD-10-CM | POA: Insufficient documentation

## 2020-10-06 DIAGNOSIS — Z87891 Personal history of nicotine dependence: Secondary | ICD-10-CM | POA: Insufficient documentation

## 2020-10-06 DIAGNOSIS — Z79899 Other long term (current) drug therapy: Secondary | ICD-10-CM | POA: Insufficient documentation

## 2020-10-06 DIAGNOSIS — N1832 Chronic kidney disease, stage 3b: Secondary | ICD-10-CM | POA: Insufficient documentation

## 2020-10-06 LAB — GROUP A STREP BY PCR: Group A Strep by PCR: NOT DETECTED

## 2020-10-06 LAB — RESP PANEL BY RT-PCR (FLU A&B, COVID) ARPGX2
Influenza A by PCR: POSITIVE — AB
Influenza B by PCR: NEGATIVE
SARS Coronavirus 2 by RT PCR: NEGATIVE

## 2020-10-06 MED ORDER — ALBUTEROL SULFATE HFA 108 (90 BASE) MCG/ACT IN AERS
1.0000 | INHALATION_SPRAY | Freq: Once | RESPIRATORY_TRACT | Status: AC
  Administered 2020-10-06: 2 via RESPIRATORY_TRACT
  Filled 2020-10-06: qty 6.7

## 2020-10-06 MED ORDER — BENZONATATE 100 MG PO CAPS
100.0000 mg | ORAL_CAPSULE | Freq: Three times a day (TID) | ORAL | 0 refills | Status: DC
  Filled 2020-10-06: qty 21, 7d supply, fill #0

## 2020-10-06 MED ORDER — ACETAMINOPHEN 500 MG PO TABS
1000.0000 mg | ORAL_TABLET | Freq: Once | ORAL | Status: AC
  Administered 2020-10-06: 1000 mg via ORAL
  Filled 2020-10-06: qty 2

## 2020-10-06 NOTE — ED Provider Notes (Signed)
[facility]ENCY DEPARTMENT Provider Note   CSN: [id number] Arrival date & time: 10/06/20  1319     History Chief Complaint  Patient presents with   Cough   Sore William Long is a 35 y.o. male with PMH/o CKD, HTN who presents for evaluation of cough x 2 weeks.  He states that the cough is productive of phlegm.  He feels like it is getting worse.  He also has had some nasal congestion, rhinorrhea.  He also reports that over the last 2 days, his throat has been hurting.  He has been able to swallow and tolerate secretions any difficulty.  He has not measured any fevers at home but has had some chills. He has not had any nausea, vomiting, difficulty breathing.  He took an at-home COVID test and states it was negative.  The history is provided by the patient.      Past Medical History:  Diagnosis Date   CKD (chronic kidney disease) stage 3, GFR 30-59 ml/min (HCC)    Hypertension     Patient Active Problem List   Diagnosis Date Noted   Pure hypercholesterolemia 09/22/2019   Seasonal allergies 07/14/2019   Stage 3b chronic kidney disease    Snoring 10/21/2017   Essential hypertension 10/21/2017    History reviewed. No pertinent surgical history.     Family History  Problem Relation Age of Onset   Diabetes Maternal Grandfather     Social History   Tobacco Use   Smoking status: Former    Pack years: 0.00   Smokeless tobacco: Former  Scientific laboratory technician Use: Never used  Substance Use Topics   Alcohol use: Not Currently   Drug use: Not Currently    Home Medications Prior to Admission medications   Medication Sig Start Date End Date Taking? Authorizing Provider  benzonatate (TESSALON) 100 MG capsule Take 1 capsule (100 mg total) by mouth every 8 (eight) hours. 10/06/20  Yes Volanda Napoleon, PA-C  fluticasone (FLONASE) 50 MCG/ACT nasal spray Place 2 sprays into both nostrils daily. 07/14/19   Shelda Pal, DO  lisinopril  (ZESTRIL) 5 MG tablet Take 1 tablet (5 mg total) by mouth daily. 07/14/19   Shelda Pal, DO  sodium chloride (OCEAN) 0.65 % SOLN nasal spray Place 1 spray into both nostrils daily as needed for congestion.    [provider]    Allergies    Patient has no known allergies.  Review of Systems   Review of Systems  Constitutional:  Positive for chills. Negative for fever.  HENT:  Positive for congestion, rhinorrhea and sore throat.   Respiratory:  Positive for cough. Negative for shortness of breath.   Gastrointestinal:  Negative for abdominal pain, nausea and vomiting.  All other systems reviewed and are negative.  Physical Exam Updated Vital Signs BP (!) 134/95 (BP Location: Left Arm)   Pulse (!) 121   Temp (!) 101.5 F (38.6 C) (Oral)   Resp 20   Ht '5\' 5"'$  (1.651 m)   Wt 94.1 kg   SpO2 99%   BMI 34.53 kg/m   Physical Exam Vitals and nursing note reviewed.  Constitutional:      Appearance: He is well-developed.  HENT:     Head: Normocephalic and atraumatic.     Mouth/Throat:     Comments: Posterior oropharynx is erythematous.  Small amount of exudate noted on the left tonsil.  Uvula is midline.  Airways  patent phonation is intact. Eyes:     General: No scleral icterus.       Right eye: No discharge.        Left eye: No discharge.     Conjunctiva/sclera: Conjunctivae normal.  Pulmonary:     Effort: Pulmonary effort is normal.     Comments: Intermittently coughing.  He has a very small amount of mild expiratory wheezing in upper lung fields.  No rales, rhonchi. Skin:    General: Skin is warm and dry.  Neurological:     Mental Status: He is alert.  Psychiatric:        Speech: Speech normal.        Behavior: Behavior normal.    ED Results / Procedures / Treatments   Labs (all labs ordered are listed, but only abnormal results are displayed) Labs Reviewed  RESP PANEL BY RT-PCR (FLU A&B, COVID) ARPGX2 - Abnormal; Notable for the following  components:      Result Value   Influenza A by PCR POSITIVE (*)    All other components within normal limits  GROUP A STREP BY PCR    EKG None  Radiology DG Chest Port 1 View  Result Date: 10/06/2020 CLINICAL DATA:  Cough, sore throat EXAM: PORTABLE CHEST 1 VIEW COMPARISON:  None. FINDINGS: The heart size and mediastinal contours are within normal limits. Both lungs are clear. The visualized skeletal structures are unremarkable. IMPRESSION: No acute abnormality of the lungs in AP portable projection. Electronically Signed   By: Eddie Candle M.D.   On: 10/06/2020 16:14    Procedures Procedures   Medications Ordered in ED Medications  albuterol (VENTOLIN HFA) 108 (90 Base) MCG/ACT inhaler 1-2 puff (2 puffs Inhalation Given 10/06/20 1602)  acetaminophen (TYLENOL) tablet 1,000 mg (1,000 mg Oral Given 10/06/20 1654)    ED Course  I have reviewed the triage vital signs and the nursing notes.  Pertinent labs & imaging results that were available during my care of the patient were reviewed by me and considered in my medical decision making (see chart for details).    MDM Rules/Calculators/A&P                          35 year old male who presents for evaluation of cough, sore throat, rhinorrhea, nasal congestion.  Has had chills at home but no fevers.  He reports he is fatigued.  He has been able to tolerate secretions.  No difficulty breathing, vomiting.  On initial arrival, he is afebrile, he is slightly tachycardic and hypertensive.  Vitals are otherwise stable.  He is well-appearing on exam.  He does have some posterior oropharynx erythema, exudate.  Lung exam, he has a small wheezing noted.  Consider pharyngitis versus viral URI versus COVID versus flu.  History/physical exam not concerning for Ludwig angina, peritonsillar abscess.  Will check chest x-ray given wheezing, COVID, flu.  Strep is negative. CXR is unremarkable.   Discussed results with patient.  Patient reports feeling  better after albuterol inhaler.  At this time, patient is stable and well-appearing.  Patient has a COVID test pending as well as flu test.  Instructed him that he can follow-up on those results. At this time, patient exhibits no emergent life-threatening condition that require further evaluation in ED. Patient had ample opportunity for questions and discussion. All patient's questions were answered with full understanding. Strict return precautions discussed. Patient expresses understanding and agreement to plan.    Portions of this  note were generated with [employer]. Dictation errors may occur despite best attempts at proofreading.   Final Clinical Impression(s) / ED Diagnoses Final diagnoses:  Cough  Viral illness  Sore throat    Rx / DC Orders ED Discharge Orders          Ordered    benzonatate (TESSALON) 100 MG capsule  Every 8 hours        10/06/20 1634             Desma Mcgregor 10/06/20 2002    Lucrezia Starch, MD 10/07/20 1700

## 2020-10-06 NOTE — Discharge Instructions (Addendum)
You have a COVID and flu test pending. This may take several hours to come back. You can check online or use the MyChart App to look at your results.   Use albuterol inhaler as directed.  As we discussed, it can make you feel little bit jittery.  Your blood pressure slightly high here today.  Please follow-up with your primary care doctor.  Take Tessalon Perles as directed.  Return emergency department for any difficulty breathing, vomiting, fevers or any other worsening concerning symptoms.

## 2020-10-06 NOTE — ED Triage Notes (Signed)
Cough and sore throat for a week. His home Covid test was negative.

## 2020-10-14 ENCOUNTER — Ambulatory Visit (INDEPENDENT_AMBULATORY_CARE_PROVIDER_SITE_OTHER): Payer: BC Managed Care – PPO | Admitting: Family Medicine

## 2020-10-14 ENCOUNTER — Encounter: Payer: Self-pay | Admitting: Family Medicine

## 2020-10-14 ENCOUNTER — Other Ambulatory Visit: Payer: Self-pay

## 2020-10-14 VITALS — BP 128/78 | HR 79 | Temp 98.7°F | Ht 65.0 in | Wt 203.0 lb

## 2020-10-14 DIAGNOSIS — Z1159 Encounter for screening for other viral diseases: Secondary | ICD-10-CM

## 2020-10-14 DIAGNOSIS — Z0000 Encounter for general adult medical examination without abnormal findings: Secondary | ICD-10-CM

## 2020-10-14 LAB — COMPREHENSIVE METABOLIC PANEL
ALT: 30 U/L (ref 0–53)
AST: 22 U/L (ref 0–37)
Albumin: 3.8 g/dL (ref 3.5–5.2)
Alkaline Phosphatase: 43 U/L (ref 39–117)
BUN: 28 mg/dL — ABNORMAL HIGH (ref 6–23)
CO2: 24 mEq/L (ref 19–32)
Calcium: 8.8 mg/dL (ref 8.4–10.5)
Chloride: 112 mEq/L (ref 96–112)
Creatinine, Ser: 2.91 mg/dL — ABNORMAL HIGH (ref 0.40–1.50)
GFR: 27.37 mL/min — ABNORMAL LOW (ref >60.00)
Glucose, Bld: 85 mg/dL (ref 70–99)
Potassium: 4.2 mEq/L (ref 3.5–5.1)
Sodium: 144 mEq/L (ref 135–145)
Total Bilirubin: 0.3 mg/dL (ref 0.2–1.2)
Total Protein: 6.2 g/dL (ref 6.0–8.3)

## 2020-10-14 LAB — CBC
HCT: 41.5 % (ref 39.0–52.0)
Hemoglobin: 14.3 g/dL (ref 13.0–17.0)
MCHC: 34.4 g/dL (ref 30.0–36.0)
MCV: 82.8 fl (ref 78.0–100.0)
Platelets: 341 10*3/uL (ref 150.0–400.0)
RBC: 5.02 Mil/uL (ref 4.22–5.81)
RDW: 13.7 % (ref 11.5–15.5)
WBC: 5.5 10*3/uL (ref 4.0–10.5)

## 2020-10-14 LAB — LIPID PANEL
Cholesterol: 182 mg/dL (ref 0–200)
HDL: 38.5 mg/dL — ABNORMAL LOW (ref >39.00)
LDL Cholesterol: 117 mg/dL — ABNORMAL HIGH (ref 0–99)
NonHDL: 143.71
Total CHOL/HDL Ratio: 5
Triglycerides: 134 mg/dL (ref 0.0–149.0)
VLDL: 26.8 mg/dL (ref 0.0–40.0)

## 2020-10-14 MED ORDER — LISINOPRIL 5 MG PO TABS: 5.0000 mg | ORAL_TABLET | Freq: Every day | ORAL | 2 refills | Status: DC

## 2020-10-14 NOTE — Progress Notes (Signed)
Chief Complaint  Patient presents with   Annual Exam    Well Male William Long is here for a complete physical.   His last physical was >1 year ago.  Current diet: in general, diet is fair.   Current exercise: none Weight trend: stable Fatigue out of ordinary? No. Seat belt? Yes.    Health maintenance Tetanus- Yes HIV- Yes Hep C- No  Past Medical History:  Diagnosis Date   CKD (chronic kidney disease) stage 3, GFR 30-59 ml/min (HCC)    Hypertension      Past Surgical History:  Procedure Laterality Date   NO PAST SURGERIES     Medications  Lisinopril 5 mg/d    Allergies No Known Allergies  Family History Family History  Problem Relation Age of Onset   Diabetes Maternal Grandfather     Review of Systems: Constitutional: no fevers or chills Eye:  no recent significant change in vision Ear/Nose/Mouth/Throat:  Ears:  no hearing loss Nose/Mouth/Throat:  no complaints of nasal congestion, no sore throat Cardiovascular:  no chest pain Respiratory:  no shortness of breath Gastrointestinal:  no abdominal pain, no change in bowel habits GU:  Male: negative for dysuria Musculoskeletal/Extremities:  no pain of the joints Integumentary (Skin/Breast):  no abnormal skin lesions reported Neurologic:  no headaches Endocrine: No unexpected weight changes Hematologic/Lymphatic:  no night sweats  Exam BP 128/78   Pulse 79   Temp 98.7 F (37.1 C) (Oral)   Ht '5\' 5"'$  (1.651 m)   Wt 203 lb (92.1 kg)   SpO2 98%   BMI 33.78 kg/m  General:  well developed, well nourished, in no apparent distress Skin:  no significant moles, warts, or growths Head:  no masses, lesions, or tenderness Eyes:  pupils equal and round, sclera anicteric without injection Ears:  canals without lesions, TMs shiny without retraction, no obvious effusion, no erythema Nose:  nares patent, septum midline, mucosa normal Throat/Pharynx:  lips and gingiva without lesion; tongue and uvula midline;  non-inflamed pharynx; no exudates or postnasal drainage Neck: neck supple without adenopathy, thyromegaly, or masses Lungs:  clear to auscultation, breath sounds equal bilaterally, no respiratory distress Cardio:  regular rate and rhythm, no bruits, no LE edema Abdomen:  abdomen soft, nontender; bowel sounds normal; no masses or organomegaly Genital (male): Deferred Rectal: Deferred Musculoskeletal:  symmetrical muscle groups noted without atrophy or deformity Extremities:  no clubbing, cyanosis, or edema, no deformities, no skin discoloration Neuro:  gait normal; deep tendon reflexes normal and symmetric Psych: well oriented with normal range of affect and appropriate judgment/insight  Assessment and Plan  Well adult exam - Plan: CBC, Comprehensive metabolic panel, Lipid panel  Encounter for hepatitis C screening test for low risk patient - Plan: Hepatitis C antibody   Well 35 y.o. male. Counseled on diet and exercise. Self testicular exams recommended at least monthly.  Other orders as above. Follow up in 6 mo pending the above workup. The patient voiced understanding and agreement to the plan.  [place], DO 10/14/20 7:52 AM

## 2020-10-14 NOTE — Patient Instructions (Addendum)
Give [place] 2-3 business days to get the results of your labs back.   Keep the diet clean and stay active.  Do monthly self testicular checks in the shower. You are feeling for lumps/bumps that don't belong. If you feel anything like this, let me know!  Let me know if/when you would like a referral to the urology team for a vasectomy.   Consider Metamucil to help with your bowels.   If you want your skin tag(s) removed, please let me know. It may be worth while checking with insurance to see if this is covered also.   Let us know if you need anything.

## 2020-10-18 LAB — HEPATITIS C ANTIBODY
Hepatitis C Ab: NONREACTIVE
SIGNAL TO CUT-OFF: 0.01 (ref <1.00)

## 2020-12-07 ENCOUNTER — Other Ambulatory Visit: Payer: Self-pay | Admitting: Family Medicine

## 2020-12-07 DIAGNOSIS — G4733 Obstructive sleep apnea (adult) (pediatric): Secondary | ICD-10-CM

## 2020-12-07 DIAGNOSIS — N1832 Chronic kidney disease, stage 3b: Secondary | ICD-10-CM

## 2021-02-02 ENCOUNTER — Other Ambulatory Visit (HOSPITAL_BASED_OUTPATIENT_CLINIC_OR_DEPARTMENT_OTHER): Payer: Self-pay

## 2021-02-02 ENCOUNTER — Other Ambulatory Visit: Payer: Self-pay

## 2021-02-02 ENCOUNTER — Ambulatory Visit (INDEPENDENT_AMBULATORY_CARE_PROVIDER_SITE_OTHER): Payer: Self-pay | Admitting: Medical

## 2021-02-02 VITALS — BP 133/80 | HR 70 | Temp 98.2°F | Resp 18 | Ht 65.0 in | Wt 211.6 lb

## 2021-02-02 DIAGNOSIS — L853 Xerosis cutis: Secondary | ICD-10-CM

## 2021-02-02 DIAGNOSIS — N184 Chronic kidney disease, stage 4 (severe): Secondary | ICD-10-CM

## 2021-02-02 DIAGNOSIS — L509 Urticaria, unspecified: Secondary | ICD-10-CM

## 2021-02-02 LAB — COMPREHENSIVE METABOLIC PANEL
ALT: 21 U/L (ref 0–53)
AST: 21 U/L (ref 0–37)
Albumin: 4.2 g/dL (ref 3.5–5.2)
Alkaline Phosphatase: 50 U/L (ref 39–117)
BUN: 25 mg/dL — ABNORMAL HIGH (ref 6–23)
CO2: 25 mEq/L (ref 19–32)
Calcium: 8.9 mg/dL (ref 8.4–10.5)
Chloride: 105 mEq/L (ref 96–112)
Creatinine, Ser: 2.86 mg/dL — ABNORMAL HIGH (ref 0.40–1.50)
GFR: 27.89 mL/min — ABNORMAL LOW (ref >60.00)
Glucose, Bld: 91 mg/dL (ref 70–99)
Potassium: 4.4 mEq/L (ref 3.5–5.1)
Sodium: 138 mEq/L (ref 135–145)
Total Bilirubin: 0.4 mg/dL (ref 0.2–1.2)
Total Protein: 6.8 g/dL (ref 6.0–8.3)

## 2021-02-02 LAB — AMMONIA: Ammonia: 56 umol/L — ABNORMAL HIGH (ref 11–35)

## 2021-02-02 MED ORDER — HYDROXYZINE PAMOATE 25 MG PO CAPS
25.0000 mg | ORAL_CAPSULE | Freq: Three times a day (TID) | ORAL | 0 refills | Status: DC | PRN
  Filled 2021-02-02: qty 30, 10d supply, fill #0

## 2021-02-02 NOTE — Patient Instructions (Addendum)
For recent skin itching recommend not to shave skin in future as this can irritate skin and could lead to folliculitis/skin infection.  Recommend moisturize skin twice daily, avoid hot showers and use dove moisturizing soap.  Rx hydroxzine for itching. Rx advisement given.  For ckd will get cmp and add ammonia level to evaluate remote possible causes of itching.   Please call the nephrologist office and get scheduled as soon as possible.  Follow up in 2 weeks or sooner if needed.

## 2021-02-02 NOTE — Progress Notes (Signed)
Subjective:    Patient ID: William Long, male    DOB: Mar 02, 1986, 35 y.o.   MRN: [id number]  HPI  Pt in with some rash and itching to his skin.  Pt states one month ago he got electric shaver and he shaved chest, abdomen, upper legs and groin.   About one week later these areas were itching but no rednes, warmth or tenderness.  Pt does have ckd. He has normal liver enzymes. Rare alcohol use.  Pt has been referred to nephrologist. They have called pt but he has not called them back.   Over the years he mentions some itchy skin in winter.     Review of Systems  Constitutional:  Negative for chills, fatigue and fever.  HENT:  Negative for congestion.   Respiratory:  Negative for cough, chest tightness, shortness of breath and wheezing.   Cardiovascular:  Negative for chest pain and palpitations.  Gastrointestinal:  Negative for abdominal distention, abdominal pain, blood in stool, diarrhea, rectal pain and vomiting.  Genitourinary:  Negative for frequency.  Musculoskeletal:  Negative for back pain and joint swelling.  Neurological:  Negative for dizziness and headaches.  Hematological:  Negative for adenopathy. Does not bruise/bleed easily.  Psychiatric/Behavioral:  Negative for behavioral problems and confusion.    Past Medical History:  Diagnosis Date   CKD (chronic kidney disease) stage 3, GFR 30-59 ml/min (HCC)    Hypertension      Social History   Socioeconomic History   Marital status: Married    Spouse name: Not on file   Number of children: Not on file   Years of education: Not on file   Highest education level: Not on file  Occupational History   Not on file  Tobacco Use   Smoking status: Former   Smokeless tobacco: Former  Scientific laboratory technician Use: Never used  Substance and Sexual Activity   Alcohol use: Not Currently   Drug use: Not Currently   Sexual activity: Yes  Other Topics Concern   Not on file  Social History Narrative   Not on file    Social Determinants of Health   Financial Resource Strain: Not on file  Food Insecurity: Not on file  Transportation Needs: Not on file  Physical Activity: Not on file  Stress: Not on file  Social Connections: Not on file  Intimate Partner Violence: Not on file    Past Surgical History:  Procedure Laterality Date   NO PAST SURGERIES      Family History  Problem Relation Age of Onset   Diabetes Maternal Grandfather     No Known Allergies  Current Outpatient Medications on File Prior to Visit  Medication Sig Dispense Refill   lisinopril (ZESTRIL) 5 MG tablet Take 1 tablet (5 mg total) by mouth daily. 90 tablet 2   No current facility-administered medications on file prior to visit.    BP 133/80   Pulse 70   Temp 98.2 F (36.8 C)   Resp 18   Ht '5\' 5"'$  (1.651 m)   Wt 211 lb 9.6 oz (96 kg)   SpO2 98%   BMI 35.21 kg/m        Objective:   Physical Exam  General Mental Status- Alert. General Appearance- Not in acute distress.     Neck Carotid Arteries- Normal color. Moisture- Normal Moisture. No carotid bruits. No JVD.  Chest and Lung Exam Auscultation: Breath Sounds:-Normal.  Cardiovascular Auscultation:Rythm- Regular. Murmurs & Other Heart Sounds:Auscultation  of the heart reveals- No Murmurs.  Abdomen Inspection:-Inspeection Normal. Palpation/Percussion:Note:No mass. Palpation and Percussion of the abdomen reveal- Non Tender, Non Distended + BS, no rebound or guarding.    Neurologic Cranial Nerve exam:- CN III-XII intact(No nystagmus), symmetric smile. Strength:- 5/5 equal and symmetric strength both upper and lower extremities.   Skin- no redness, no swelling, no induration of prior areas that were shaves. No obvious dried skin.    Assessment & Plan:   Patient Instructions  For recent skin itching recommend not to shave skin in future as this can irritate skin and could lead to folliculitis/skin infection.  Recommend moisturize skin twice  daily, avoid hot showers and use dove moisturizing soap.  For ckd will get cmp and add ammonia level to evaluate remote possible causes of itching.   Please call the nephrologist office and get scheduled as soon as possible.  Follow up in 2 weeks or sooner if needed.   Mackie Pai, PA-C

## 2021-02-13 ENCOUNTER — Other Ambulatory Visit: Payer: Self-pay | Admitting: Family Medicine

## 2021-02-13 DIAGNOSIS — N184 Chronic kidney disease, stage 4 (severe): Secondary | ICD-10-CM

## 2021-02-13 DIAGNOSIS — N1832 Chronic kidney disease, stage 3b: Secondary | ICD-10-CM

## 2021-02-13 NOTE — Progress Notes (Signed)
referral

## 2021-02-28 ENCOUNTER — Institutional Professional Consult (permissible substitution): Payer: Self-pay | Admitting: Neurology

## 2021-03-20 ENCOUNTER — Other Ambulatory Visit (HOSPITAL_COMMUNITY): Payer: Self-pay | Admitting: Nephrology

## 2021-03-20 DIAGNOSIS — N184 Chronic kidney disease, stage 4 (severe): Secondary | ICD-10-CM

## 2021-03-30 ENCOUNTER — Other Ambulatory Visit: Payer: Self-pay | Admitting: Radiology

## 2021-03-31 ENCOUNTER — Ambulatory Visit (HOSPITAL_COMMUNITY)
Admission: RE | Admit: 2021-03-31 | Discharge: 2021-03-31 | Disposition: A | Discharge status: Home / Self Care | Payer: Managed Care, Other (non HMO) | Source: Ambulatory Visit | Attending: Nephrology | Admitting: Nephrology

## 2021-03-31 ENCOUNTER — Other Ambulatory Visit: Payer: Self-pay

## 2021-03-31 DIAGNOSIS — N184 Chronic kidney disease, stage 4 (severe): Secondary | ICD-10-CM | POA: Diagnosis present

## 2021-03-31 DIAGNOSIS — R809 Proteinuria, unspecified: Secondary | ICD-10-CM | POA: Diagnosis not present

## 2021-03-31 DIAGNOSIS — I129 Hypertensive chronic kidney disease with stage 1 through stage 4 chronic kidney disease, or unspecified chronic kidney disease: Secondary | ICD-10-CM | POA: Diagnosis not present

## 2021-03-31 LAB — CBC
HCT: 42.4 % (ref 39.0–52.0)
Hemoglobin: 14.3 g/dL (ref 13.0–17.0)
MCH: 28.8 pg (ref 26.0–34.0)
MCHC: 33.7 g/dL (ref 30.0–36.0)
MCV: 85.5 fL (ref 80.0–100.0)
Platelets: 267 10*3/uL (ref 150–400)
RBC: 4.96 MIL/uL (ref 4.22–5.81)
RDW: 12.8 % (ref 11.5–15.5)
WBC: 5.9 10*3/uL (ref 4.0–10.5)
nRBC: 0 % (ref 0.0–0.2)

## 2021-03-31 LAB — PROTIME-INR
INR: 1 (ref 0.8–1.2)
Prothrombin Time: 13.1 seconds (ref 11.4–15.2)

## 2021-03-31 MED ORDER — FENTANYL CITRATE (PF) 100 MCG/2ML IJ SOLN
INTRAMUSCULAR | Status: AC
  Filled 2021-03-31: qty 2

## 2021-03-31 MED ORDER — ACETAMINOPHEN 325 MG PO TABS
ORAL_TABLET | ORAL | Status: AC
  Filled 2021-03-31: qty 2

## 2021-03-31 MED ORDER — ACETAMINOPHEN 325 MG PO TABS
650.0000 mg | ORAL_TABLET | Freq: Once | ORAL | Status: AC
  Administered 2021-03-31: 650 mg via ORAL
  Filled 2021-03-31: qty 2

## 2021-03-31 MED ORDER — FENTANYL CITRATE (PF) 100 MCG/2ML IJ SOLN
INTRAMUSCULAR | Status: AC | PRN
  Administered 2021-03-31: 50 ug via INTRAVENOUS

## 2021-03-31 MED ORDER — MIDAZOLAM HCL 2 MG/2ML IJ SOLN
INTRAMUSCULAR | Status: AC | PRN
  Administered 2021-03-31: 1 mg via INTRAVENOUS

## 2021-03-31 MED ORDER — SODIUM CHLORIDE 0.9 % IV SOLN: INTRAVENOUS | Status: DC

## 2021-03-31 MED ORDER — GELATIN ABSORBABLE 12-7 MM EX MISC
CUTANEOUS | Status: AC
  Filled 2021-03-31: qty 1

## 2021-03-31 MED ORDER — MIDAZOLAM HCL 2 MG/2ML IJ SOLN
INTRAMUSCULAR | Status: AC
  Filled 2021-03-31: qty 2

## 2021-03-31 MED ORDER — LIDOCAINE HCL (PF) 1 % IJ SOLN
INTRAMUSCULAR | Status: AC
  Filled 2021-03-31: qty 30

## 2021-03-31 NOTE — Discharge Instructions (Signed)

## 2021-03-31 NOTE — H&P (Signed)
Chief Complaint: Rising creatinine and pruritis. Request is for random renal biopsy   Referring Physician(s): Singh,Vikas  Supervising Physician: Ruthann Cancer  Patient Status: [facility] - Out-pt  History of Present Illness: William Long is a 35 y.o. male outpatient. History of  CKD, HTN, Proteinuria, HLD. 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  The patient was placed on lisinopril however his Cr continues to rise and he has pruritis Team is requesting random renal biopsy for further evaluation   Past Medical History:  Diagnosis Date   CKD (chronic kidney disease) stage 3, GFR 30-59 ml/min (HCC)    Hypertension     Past Surgical History:  Procedure Laterality Date   NO PAST SURGERIES      Allergies: Patient has no known allergies.  Medications: Prior to Admission medications   Medication Sig Start Date End Date Taking? Authorizing Provider  bismuth subsalicylate (PEPTO BISMOL) 262 MG/15ML suspension Take 30 mLs by mouth every 6 (six) hours as needed for diarrhea or loose stools or indigestion.   Yes [provider]  ibuprofen (ADVIL) 200 MG tablet Take 400 mg by mouth every 6 (six) hours as needed for headache or moderate pain.   Yes [provider]  lisinopril (ZESTRIL) 5 MG tablet Take 1 tablet (5 mg total) by mouth daily. 10/14/20  Yes Wendling, Crosby Oyster, DO  OVER THE COUNTER MEDICATION Take 1 tablet by mouth 2 (two) times a week. Memory multi vitamin   Yes [provider]  OVER THE COUNTER MEDICATION Take 1 tablet by mouth once a week. Heart vitamin   Yes [provider]  sodium chloride (OCEAN) 0.65 % SOLN nasal spray Place 1 spray into both nostrils as needed for congestion.   Yes [provider]  hydrOXYzine (VISTARIL) 25 MG capsule Take 1 capsule (25 mg total)  by mouth every 8 (eight) hours as needed for itching. Patient not taking: Reported on 03/28/2021 02/02/21   Saguier, Percell Miller, PA-C     Family History  Problem Relation Age of Onset   Diabetes Maternal Grandfather     Social History   Socioeconomic History   Marital status: Married    Spouse name: Not on file   Number of children: Not on file   Years of education: Not on file   Highest education level: Not on file  Occupational History   Not on file  Tobacco Use   Smoking status: Former   Smokeless tobacco: Former  Scientific laboratory technician Use: Never used  Substance and Sexual Activity   Alcohol use: Not Currently   Drug use: Not Currently   Sexual activity: Yes  Other Topics Concern   Not on file  Social History Narrative   Not on file   Social Determinants of Health   Financial Resource Strain: Not on file  Food Insecurity: Not on file  Transportation Needs: Not on file  Physical Activity: Not on file  Stress: Not on file  Social Connections: Not on file     Review of Systems: A 12 point ROS discussed and pertinent positives are indicated in the HPI above.  All other systems are negative.  Review of Systems  Constitutional:  Negative for fever.  HENT:  Negative for congestion.   Respiratory:  Negative for cough and shortness of breath.   Cardiovascular:  Negative for chest pain.  Gastrointestinal:  Negative for abdominal pain.  Neurological:  Negative for headaches.  Psychiatric/Behavioral:  Negative for behavioral problems and confusion.    Vital Signs: BP 122/83 (BP Location: Right Arm)    Pulse 60    Temp 98.2 F (36.8 C) (Oral)    Resp 18    Ht 5\' 5"  (1.651 m)    Wt 200 lb (90.7 kg)    SpO2 100%    BMI 33.28 kg/m   Physical Exam Vitals and nursing note reviewed.  Constitutional:      Appearance: He is well-developed.  HENT:     Head: Normocephalic.  Cardiovascular:     Rate and Rhythm: Normal rate and regular rhythm.     Heart sounds: Normal heart  sounds.  Pulmonary:     Effort: Pulmonary effort is normal.  Musculoskeletal:        General: Normal range of motion.     Cervical back: Normal range of motion.  Skin:    General: Skin is dry.  Neurological:     Mental Status: He is alert and oriented to person, place, and time.    Imaging: No results found.  Labs:  CBC: Recent Labs    10/14/20 0753 03/31/21 0554  WBC 5.5 5.9  HGB 14.3 14.3  HCT 41.5 42.4  PLT 341.0 267    COAGS: Recent Labs    03/31/21 0554  INR 1.0    BMP: Recent Labs    10/14/20 0753 02/02/21 1237  NA 144 138  K 4.2 4.4  CL 112 105  CO2 24 25  GLUCOSE 85 91  BUN 28* 25*  CALCIUM 8.8 8.9  CREATININE 2.91* 2.86*    LIVER FUNCTION TESTS: Recent Labs    10/14/20 0753 02/02/21 1237  BILITOT 0.3 0.4  AST 22 21  ALT 30 21  ALKPHOS 43 50  PROT 6.2 6.8  ALBUMIN 3.8 4.2      Assessment and Plan:   35 y.o. male outpatient. History of  CKD, HTN, Proteinuria, HLD. 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  The patient was placed on lisinopril however his Cr continues to rise and he has pruritis. Team is requesting random renal biopsy for further evaluation   All labs and medications are within acceptable parameters. NKDA. Patient has been NPO since midnight.   Risks and benefits of random  renal biopsy was discussed with the patient and/or patient's family including, but not limited to bleeding, infection, damage to adjacent structures or low yield requiring additional tests.  All of the questions were answered and there is agreement to proceed.  Consent signed and in chart.   Thank you for this interesting consult.  I greatly enjoyed [place] and look forward to participating in their care.  A copy of this report was sent to the requesting provider on this  date.  Electronically Signed: Jacqualine Mau, NP 03/31/2021, 7:11 AM   I spent a total of  30 Minutes   in face to face in clinical consultation, greater than 50% of which was counseling/coordinating care for  random renal biopsy

## 2021-03-31 NOTE — Procedures (Signed)
Interventional Radiology Procedure Note  Procedure: Ultrasound guided renal biopsy  Findings: Please refer to procedural dictation for full description.  Left inferior pole.  16 ga core x3.  Gelfoam needle track embolization.  Complications: None immediate  Estimated Blood Loss: < 5 mL  Recommendations: Strict 3 hour bedrest. Follow up Pathology results.   Ruthann Cancer, MD Pager: [phone]

## 2021-04-21 ENCOUNTER — Ambulatory Visit: Payer: Managed Care, Other (non HMO) | Admitting: Family Medicine

## 2021-04-21 ENCOUNTER — Encounter: Payer: Self-pay | Admitting: Family Medicine

## 2021-04-21 ENCOUNTER — Other Ambulatory Visit (HOSPITAL_BASED_OUTPATIENT_CLINIC_OR_DEPARTMENT_OTHER): Payer: Self-pay

## 2021-04-21 VITALS — BP 110/68 | HR 74 | Temp 98.2°F | Ht 65.0 in | Wt 203.4 lb

## 2021-04-21 DIAGNOSIS — N184 Chronic kidney disease, stage 4 (severe): Secondary | ICD-10-CM

## 2021-04-21 DIAGNOSIS — I10 Essential (primary) hypertension: Secondary | ICD-10-CM

## 2021-04-21 DIAGNOSIS — E7800 Pure hypercholesterolemia, unspecified: Secondary | ICD-10-CM

## 2021-04-21 DIAGNOSIS — L299 Pruritus, unspecified: Secondary | ICD-10-CM

## 2021-04-21 DIAGNOSIS — Z23 Encounter for immunization: Secondary | ICD-10-CM | POA: Diagnosis not present

## 2021-04-21 LAB — BASIC METABOLIC PANEL
BUN: 30 mg/dL — ABNORMAL HIGH (ref 6–23)
CO2: 25 mEq/L (ref 19–32)
Calcium: 9.2 mg/dL (ref 8.4–10.5)
Chloride: 105 mEq/L (ref 96–112)
Creatinine, Ser: 3.04 mg/dL — ABNORMAL HIGH (ref 0.40–1.50)
GFR: 25.88 mL/min — ABNORMAL LOW (ref >60.00)
Glucose, Bld: 90 mg/dL (ref 70–99)
Potassium: 4.7 mEq/L (ref 3.5–5.1)
Sodium: 139 mEq/L (ref 135–145)

## 2021-04-21 LAB — LIPID PANEL
Cholesterol: 248 mg/dL — ABNORMAL HIGH (ref 0–200)
HDL: 39.9 mg/dL (ref >39.00)
LDL Cholesterol: 177 mg/dL — ABNORMAL HIGH (ref 0–99)
NonHDL: 208.05
Total CHOL/HDL Ratio: 6
Triglycerides: 155 mg/dL — ABNORMAL HIGH (ref 0.0–149.0)
VLDL: 31 mg/dL (ref 0.0–40.0)

## 2021-04-21 MED ORDER — TRIAMCINOLONE ACETONIDE 0.1 % EX CREA
1.0000 "application " | TOPICAL_CREAM | Freq: Two times a day (BID) | CUTANEOUS | 0 refills | Status: DC
  Filled 2021-04-21: qty 454, 60d supply, fill #0

## 2021-04-21 MED ORDER — LEVOCETIRIZINE DIHYDROCHLORIDE 5 MG PO TABS
5.0000 mg | ORAL_TABLET | Freq: Every evening | ORAL | 2 refills | Status: DC
  Filled 2021-04-21: qty 30, 30d supply, fill #0

## 2021-04-21 NOTE — Patient Instructions (Addendum)
Keep the diet clean and stay active.  Try the cream as needed.  Give [place] 2-3 business days to get the results of your labs back.   Let us know if you need anything.

## 2021-04-21 NOTE — Addendum Note (Signed)
Addended by: Sharon Seller B on: 04/21/2021 07:59 AM   Modules accepted: Orders

## 2021-04-21 NOTE — Progress Notes (Signed)
Chief Complaint  Patient presents with   Follow-up    6 month    Subjective William Long is a 36 y.o. male who presents for hypertension follow up. He does not monitor home blood pressures. He is compliant with medication- lisinopril 5 mg/d.  Patient has these side effects of medication: none He is usually adhering to a healthy diet overall. Current exercise: active at work No Cp or SOB.   Itching for 5 mo in lower extremities, troso. Worse at night. Does not turn red. No sick contacts. No new soaps, lotions, topicals, detergents. Tried an expensive topical that helped a little bit. Uses Tide for detergent.    Past Medical History:  Diagnosis Date   CKD (chronic kidney disease) stage 3, GFR 30-59 ml/min (HCC)    Hypertension     Exam BP 110/68    Pulse 74    Temp 98.2 F (36.8 C) (Oral)    Ht 5\' 5"  (1.651 m)    Wt 203 lb 6 oz (92.3 kg)    SpO2 99%    BMI 33.84 kg/m  General:  well developed, well nourished, in no apparent distress Heart: RRR, no bruits, no LE edema Lungs: clear to auscultation, no accessory muscle use Psych: well oriented with normal range of affect and appropriate judgment/insight  Essential hypertension  Chronic pruritus - Plan: triamcinolone cream (KENALOG) 0.1 %, levocetirizine (XYZAL) 5 MG tablet  Chronic kidney failure, stage 4 (severe) (HCC) - Plan: Basic metabolic panel  Pure hypercholesterolemia - Plan: Lipid panel  Chronic, stable. Cont Lisinopril 5 mg/d. Counseled on diet and exercise. Chronic, uncontrolled. Trial Xyzal 5 mg/d. Trial Kenalog prn. Try not to scratch. Change out detergents to unscented products.  Follows w [facility].  Ck labs today.  F/u in 6 mo for CPE or prn. The patient voiced understanding and agreement to the plan.  [place], DO 04/21/21  7:53 AM

## 2021-04-21 NOTE — Progress Notes (Signed)
k

## 2021-04-22 LAB — SURGICAL PATHOLOGY

## 2021-04-25 ENCOUNTER — Encounter (HOSPITAL_COMMUNITY): Payer: Self-pay

## 2021-05-09 ENCOUNTER — Institutional Professional Consult (permissible substitution): Payer: Managed Care, Other (non HMO) | Admitting: Neurology

## 2021-08-21 ENCOUNTER — Encounter: Payer: Self-pay | Admitting: Neurology

## 2021-08-21 ENCOUNTER — Other Ambulatory Visit (HOSPITAL_BASED_OUTPATIENT_CLINIC_OR_DEPARTMENT_OTHER): Payer: Self-pay

## 2021-08-21 ENCOUNTER — Ambulatory Visit: Payer: BLUE CROSS/BLUE SHIELD | Admitting: Neurology

## 2021-08-21 VITALS — BP 145/98 | HR 66 | Ht 65.0 in | Wt 197.5 lb

## 2021-08-21 DIAGNOSIS — R519 Headache, unspecified: Secondary | ICD-10-CM

## 2021-08-21 DIAGNOSIS — R0683 Snoring: Secondary | ICD-10-CM | POA: Insufficient documentation

## 2021-08-21 DIAGNOSIS — J3089 Other allergic rhinitis: Secondary | ICD-10-CM

## 2021-08-21 DIAGNOSIS — G4753 Recurrent isolated sleep paralysis: Secondary | ICD-10-CM | POA: Insufficient documentation

## 2021-08-21 DIAGNOSIS — G4719 Other hypersomnia: Secondary | ICD-10-CM

## 2021-08-21 DIAGNOSIS — N184 Chronic kidney disease, stage 4 (severe): Secondary | ICD-10-CM | POA: Diagnosis not present

## 2021-08-21 MED ORDER — TRIAMCINOLONE ACETONIDE 55 MCG/ACT NA AERO
2.0000 | INHALATION_SPRAY | Freq: Every day | NASAL | 12 refills | Status: DC
  Filled 2021-08-21: qty 16.9, 30d supply, fill #0
  Filled 2021-10-16: qty 16.9, 30d supply, fill #1

## 2021-08-21 MED ORDER — LEVOCETIRIZINE DIHYDROCHLORIDE 5 MG PO TABS
5.0000 mg | ORAL_TABLET | Freq: Every evening | ORAL | 0 refills | Status: DC
  Filled 2021-08-21: qty 30, 30d supply, fill #0

## 2021-08-21 MED ORDER — AMPHETAMINE-DEXTROAMPHETAMINE 5 MG PO TABS
5.0000 mg | ORAL_TABLET | Freq: Every day | ORAL | 0 refills | Status: DC
  Filled 2021-08-21: qty 30, 30d supply, fill #0

## 2021-08-21 NOTE — Progress Notes (Signed)
? ? ?SLEEP MEDICINE CLINIC ?  ? ?Provider:  Larey Seat, MD  ?Primary Care Physician:  Shelda Pal, DO ?[address] ?[place] [address]  ? ?  ?Referring Provider: Shelda Pal, Do ?[address] ?[place],  [place] [address]  ?  ?  ?    ?Chief Complaint according to patient   ?Patient presents with:  ?  ? New Patient (Initial Visit)  ?   Pt with wife, rm 52. Presents today for sleep eval. He has never had a SS. Overall can get 8 hrs of sleep on avg but it is interupted. When he wakes up he doesn't always feel rested and naps daily. He may go to lay down for 10-15 min and it can turn into 2-3 hr nap. He falls asleep mid conversation or during family games. Has fell asleep driving. Wife has witnessed snoring and apnea events.   ?  ?  ?HISTORY OF PRESENT ILLNESS:  ?William Long Real is a 36 y.o.  male patient seen here as a referral on 08/21/2021 from PCP [facility] for a sleep consultation. Marland Kitchen  ?Chief concern according to patient :  I am sleepy, very sleepy, even when we were dating , I was taking a nap between [employer] and [place]-  sometimes multiple times- and now falls asleep in conversations and in the midst of a board game.  Every day he gets 6-8 hours of sleep. He needs still to nap, and feels not refreshed. He tries for power naps. He wakes up with headaches, a dull pressure.  ? ?  ?I have the pleasure of seeing William Long Real today, a right -handed Latino male with a possible sleep disorder and   has a past medical history of CKD (chronic kidney disease) stage 3, Biopsy, GERD, GFR 30-59 ml/min (HCC) and Hypertension. Loud snoring reported by his wife, and he is a back sleeper.  ? ? ?His labs were reviewed, high BUN and high creatinine.  ?   ?Sleep relevant medical history: Nocturia 1-3 times, wisdom teeth removed-  whiplash injury once. No brain  trauma.  ?  ? Family medical /sleep history: mother has been EDS and snoring for years.  other  family member on CPAP with OSA, insomnia, sleep walkers.  58 year old son is a heavy snorer, slight overweight. He is a sleep walker, he is a restless sleeper.  ?  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 Retail . ? and lives in a household with wife and 2 young children-   one dog.  ?Tobacco use: none for 7 years .   ?ETOH use: less than 6 / week,  ?Caffeine intake in form of Coffee( 1-2 prn) Soda( has quit) Tea ( chai ) or energy drinks. ? ?  ?Sleep habits are as follows: The patient is sleepy after each meal- avoids breakfast -eats  lunch, The patient's dinner time is between 7- 7.30 PM. The patient goes to bed at 10 PM, sometimes  experiences GERD,  and continues to sleep for 2-3 hours, wakes for 1-3 bathroom breaks, the first time at 3 AM.   ?The preferred sleep position is supine, with the support of 1 pillow ?Marland Kitchen Dreams are reportedly rare- infrequent/vivid.  ?6  AM is the usual rise time.  ?The patient wakes up with an alarm.  He hits snooze several times.  ?He reports  not feeling refreshed or restored in AM, with symptoms such as dry mouth, morning headaches, and residual fatigue.  ?Naps are taken frequently,  after lunch after dinner- he needs to be woken up-  ?lasting from 30 to 120 minutes with no effect on nocturnal sleep.  ?  ?Review of Systems: ?Out of a complete 14 system review, the patient complains of only the following symptoms, and all other reviewed systems are negative.:  ?Fatigue, sleepiness , snoring, fragmented sleep,  ?  ?How likely are you to doze in the following situations: ?0 = not likely, 1 = slight chance, 2 = moderate chance, 3 = high chance ?  ?Sitting and Reading? ?Watching Television? ?Sitting inactive in a public place (theater or meeting)? ?As a passenger in a car for an hour without a break? ?Lying down in the afternoon when circumstances permit? ?Sitting and talking to someone? ?Sitting quietly after lunch without alcohol? ?In a car, while  stopped for a few minutes in traffic? ?  ?Total = 12/ 24 points  ? FSS endorsed at 50/ 63 points.  ? ?Social History  ? ?Socioeconomic History  ? Marital status: Married  ?  Spouse name: Not on file  ? Number of children: Not on file  ? Years of education: Not on file  ? Highest education level: Not on file  ?Occupational History  ? Not on file  ?Tobacco Use  ? Smoking status: Former  ? Smokeless tobacco: Former  ?Vaping Use  ? Vaping Use: Never used  ?Substance and Sexual Activity  ? Alcohol use: Not Currently  ? Drug use: Not Currently  ? Sexual activity: Yes  ?Other Topics Concern  ? Not on file  ?Social History Narrative  ? Not on file  ? ?Social Determinants of Health  ? ?Financial Resource Strain: Not on file  ?Food Insecurity: Not on file  ?Transportation Needs: Not on file  ?Physical Activity: Not on file  ?Stress: Not on file  ?Social Connections: Not on file  ? ? ?Family History  ?Problem Relation Age of Onset  ? Diabetes Maternal Grandfather   ? ? ?Past Medical History:  ?Diagnosis Date  ? CKD (chronic kidney disease) stage 3, GFR 30-59 ml/min (HCC)   ? Hypertension. ?GERD ?Hypersomnia.    ? ? ?Past Surgical History:  ?Procedure Laterality Date  ? NO PAST SURGERIES    ?  ? ?Current Outpatient Medications on File Prior to Visit  ?Medication Sig Dispense Refill  ? levocetirizine (XYZAL) 5 MG tablet Take 1 tablet (5 mg total) by mouth every evening. 30 tablet 2  ? lisinopril (ZESTRIL) 5 MG tablet Take 1 tablet (5 mg total) by mouth daily. 90 tablet 2  ? OVER THE COUNTER MEDICATION Take 1 tablet by mouth 2 (two) times a week. Memory multi vitamin    ? triamcinolone cream (KENALOG) 0.1 % Apply 1 application topically 2 (two) times daily. 454 g 0  ? ?No current facility-administered medications on file prior to visit.  ? ? ?No Known Allergies ? ?Physical exam: ? ?Today's Vitals  ? 08/21/21 1257  ?BP: (!) 145/98  ?Pulse: 66  ?Weight: 197 lb 8 oz (89.6 kg)  ?Height: 5\' 5"  (1.651 m)  ? ?Body mass index is 32.87  kg/m?.  ? ?Wt Readings from Last 3 Encounters:  ?08/21/21 197 lb 8 oz (89.6 kg)  ?04/21/21 203 lb 6 oz (92.3 kg)  ?03/31/21 200 lb (90.7 kg)  ?  ? ?Ht Readings from Last 3 Encounters:  ?08/21/21  5\' 5"  (1.651 m)  ?04/21/21 5\' 5"  (1.651 m)  ?03/31/21 5\' 5"  (1.651 m)  ?  ?  ?General: The patient is awake, alert and appears not in acute distress. The patient is well groomed. ?Head: Normocephalic, atraumatic.  ?Neck is supple. Mallampati: 2 with red lateral pillars. ,  ?neck circumference:17.25" inches .  ?Nasal airflow congested- always -  Retrognathia is not  seen.  ?Dental status: biological  ?Cardiovascular:  Regular rate and cardiac rhythm by pulse,  without distended neck veins. ?Respiratory: Lungs are clear to auscultation.  ?Skin:  Without evidence of ankle edema, or rash. ?Trunk: The patient's posture is erect. ?  ?Neurologic exam : ?The patient is awake and alert, oriented to place and time.   ?Memory subjective described as intact.  ?Attention span & concentration ability appears normal.  ?Speech is fluent,  without  dysarthria, dysphonia or aphasia.  ?Mood and affect are appropriate. ?  ?Cranial nerves: no loss of smell or taste reported  ?Pupils are equal and briskly reactive to light. Funduscopic exam deferred. Marland Kitchen  ?Extraocular movements in vertical and horizontal planes were intact and without nystagmus. No Diplopia. ?Visual fields by finger perimetry are intact. ?Hearing was intact to soft voice and finger rubbing.   ? ? Facial sensation intact to fine touch. ? Facial motor strength is symmetric and tongue and uvula move midline.  ?Neck ROM : rotation, tilt and flexion extension were normal for age and shoulder shrug was symmetrical.  ?  ?Motor exam:  Symmetric bulk, tone and ROM.   ?Normal tone without cog-wheeling, symmetric grip strength . ?  ?Sensory:  Fine touch, pinprick and vibration were tested  and  normal.  ?Proprioception tested in the upper extremities was normal. ?  ?Coordination: Rapid  alternating movements in the fingers/hands were of normal speed.  ?The Finger-to-nose maneuver was intact without evidence of ataxia, dysmetria or tremor. ?  ?Gait and station: Patient could rise unassisted from a

## 2021-08-21 NOTE — Patient Instructions (Addendum)
Screening for sleep apnea ? ?Hypersomnia ?Hypersomnia is a condition in which a person feels very tired during the day even though the person gets plenty of sleep at night. A person with this condition may take naps during the day and may find it very difficult to wake up from sleep. ?Hypersomnia may affect a person's ability to think, concentrate, drive, or remember things. ?What are the causes? ?The cause of this condition may not be known. Possible causes include: ?Taking certain medicines. ?Using drugs or alcohol. ?Sleep disorders, such as narcolepsy and sleep apnea. ?Injury to the head, brain, or spinal cord. ?Tumors. ?Certain medical conditions. These include: ?Depression. ?Diabetes. ?Gastroesophageal reflux disease (GERD). ?An underactive thyroid gland (hypothyroidism). ?What are the signs or symptoms? ?The main symptoms of hypersomnia include: ?Feeling very tired throughout the day, regardless of how much sleep you got the night before. ?Having trouble waking up. Others may find it difficult to wake you up when you are sleeping. ?Sleeping for longer and longer periods at a time. ?Taking naps throughout the day. ?Other symptoms may include: ?Feeling restless, anxious, or annoyed. ?Lacking energy. ?Having trouble with: ?Remembering. ?Speaking. ?Thinking. ?Loss of appetite. ?Seeing, hearing, tasting, smelling, or feeling things that are not real (hallucinations). ?How is this diagnosed? ?This condition may be diagnosed based on: ?Your symptoms and medical history. ?Your sleeping habits. Your health care provider may ask you to write down your sleeping habits in a daily sleep log, along with any symptoms you have. ?A series of tests that are done while you sleep (sleep study or polysomnogram). ?A test that measures how quickly you can fall asleep during the day (daytime nap study or multiple sleep latency test). ?How is this treated? ?This condition may be treated by: ?Following a regular sleep routine. ?Making  lifestyle changes, such as changing your eating habits, getting regular exercise, and avoiding alcohol or caffeinated beverages. ?Taking medicines to make you more alert (stimulants) during the day. ?Treating any underlying medical causes of hypersomnia. ?Follow these instructions at home: ?Sleep habits ?Stick to a routine that includes going to bed and waking up at the same times every day and night. ?Practice a relaxing bedtime routine. This may include reading, meditation, deep breathing, or taking a warm bath before going to sleep. ?Exercise regularly as told by your health care provider. However, avoid exercising in the hours right before bedtime. ?Keep your sleep environment at a cooler temperature, darkened, and quiet. ?Sleep with pillows and a mattress that are comfortable and supportive. ?Schedule short 20-minute naps for when you feel sleepiest during the day. ?Talk with your employer or teachers about your hypersomnia. If possible, adjust your schedule so that: ?You have a regular daytime work schedule. ?You can take a scheduled nap during the day. ?You do not have to work or be active at night. ?Do not eat a heavy meal for a few hours before bedtime. Eat your meals at about the same times every day. ?Safety ? ?Do not drive or use machinery if you are sleepy. Ask your health care provider if it is safe for you to drive. ?Wear a life jacket when swimming or spending time near water. ?General instructions ? ?Take over-the-counter and prescription medicines only as told by your health care provider. This includes supplements. ?Avoid drinking alcohol or caffeinated beverages. ?Keep a sleep log that will help your health care provider manage your condition. This may include information about: ?What time you go to bed each night. ?How often you wake up  at night. ?How many hours you sleep at night. ?How often and for how long you nap during the day. ?Any observations from others, such as leg movements during  sleep, sleep walking, or snoring. ?Keep all follow-up visits. This is important. ?Contact a health care provider if: ?You have new symptoms. ?Your symptoms get worse. ?Get help right away if: ?You have thoughts about hurting yourself or someone else. ?Get help right away if you feel like you may hurt yourself or others, or have thoughts about taking your own life. Go to your nearest emergency room or: ?Call 911. ?Call the [facility] at ([phone] or 988. This is open 24 hours a day. ?Text the Crisis Text Line at 914-745-5879. ?Summary ?Hypersomnia refers to a condition in which you feel very tired during the day even though you get plenty of sleep at night. ?A person with this condition may take naps during the day and may find it very difficult to wake up from sleep. ?Hypersomnia may affect a person's ability to think, concentrate, drive, or remember things. ?Treatment may include a regular sleep routine and making some lifestyle changes. ?This information is not intended to replace advice given to you by your health care provider. Make sure you discuss any questions you have with your health care provider. ?Document Revised: 03/13/2021 Document Reviewed: 03/13/2021 ?Elsevier Patient Education ? [place]. ? ?

## 2021-08-30 LAB — NARCOLEPSY EVALUATION
DQA1*01:02: POSITIVE
DQB1*06:02: POSITIVE

## 2021-08-30 NOTE — Progress Notes (Signed)
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

## 2021-08-31 ENCOUNTER — Telehealth: Payer: Self-pay | Admitting: Neurology

## 2021-08-31 ENCOUNTER — Other Ambulatory Visit (HOSPITAL_BASED_OUTPATIENT_CLINIC_OR_DEPARTMENT_OTHER): Payer: Self-pay

## 2021-08-31 DIAGNOSIS — D631 Anemia in chronic kidney disease: Secondary | ICD-10-CM | POA: Diagnosis not present

## 2021-08-31 DIAGNOSIS — I129 Hypertensive chronic kidney disease with stage 1 through stage 4 chronic kidney disease, or unspecified chronic kidney disease: Secondary | ICD-10-CM | POA: Diagnosis not present

## 2021-08-31 DIAGNOSIS — N184 Chronic kidney disease, stage 4 (severe): Secondary | ICD-10-CM | POA: Diagnosis not present

## 2021-08-31 DIAGNOSIS — N051 Unspecified nephritic syndrome with focal and segmental glomerular lesions: Secondary | ICD-10-CM | POA: Diagnosis not present

## 2021-08-31 MED ORDER — DAPAGLIFLOZIN PROPANEDIOL 10 MG PO TABS
ORAL_TABLET | ORAL | 3 refills | Status: DC
  Filled 2021-08-31: qty 90, 90d supply, fill #0
  Filled 2022-01-03: qty 90, 90d supply, fill #1
  Filled 2022-05-15: qty 90, 90d supply, fill #2

## 2021-08-31 NOTE — Telephone Encounter (Signed)
HST- [employer] Josem Kaufmann: [id number] (exp. 08/31/21 to 10/29/21), patient is scheudled for 08/05/21 at 4 pm.

## 2021-09-01 ENCOUNTER — Other Ambulatory Visit (HOSPITAL_BASED_OUTPATIENT_CLINIC_OR_DEPARTMENT_OTHER): Payer: Self-pay

## 2021-09-04 ENCOUNTER — Ambulatory Visit: Payer: BLUE CROSS/BLUE SHIELD | Admitting: Neurology

## 2021-09-04 ENCOUNTER — Other Ambulatory Visit (HOSPITAL_BASED_OUTPATIENT_CLINIC_OR_DEPARTMENT_OTHER): Payer: Self-pay

## 2021-09-04 DIAGNOSIS — J3089 Other allergic rhinitis: Secondary | ICD-10-CM

## 2021-09-04 DIAGNOSIS — G4733 Obstructive sleep apnea (adult) (pediatric): Secondary | ICD-10-CM | POA: Diagnosis not present

## 2021-09-04 DIAGNOSIS — R0683 Snoring: Secondary | ICD-10-CM

## 2021-09-04 DIAGNOSIS — G4753 Recurrent isolated sleep paralysis: Secondary | ICD-10-CM

## 2021-09-04 DIAGNOSIS — R519 Headache, unspecified: Secondary | ICD-10-CM

## 2021-09-04 DIAGNOSIS — G4719 Other hypersomnia: Secondary | ICD-10-CM

## 2021-09-04 DIAGNOSIS — G4734 Idiopathic sleep related nonobstructive alveolar hypoventilation: Secondary | ICD-10-CM

## 2021-09-05 ENCOUNTER — Other Ambulatory Visit (HOSPITAL_BASED_OUTPATIENT_CLINIC_OR_DEPARTMENT_OTHER): Payer: Self-pay

## 2021-09-06 ENCOUNTER — Other Ambulatory Visit (HOSPITAL_BASED_OUTPATIENT_CLINIC_OR_DEPARTMENT_OTHER): Payer: Self-pay

## 2021-09-06 ENCOUNTER — Telehealth: Payer: Self-pay | Admitting: Neurology

## 2021-09-06 DIAGNOSIS — G4733 Obstructive sleep apnea (adult) (pediatric): Secondary | ICD-10-CM

## 2021-09-06 DIAGNOSIS — G4734 Idiopathic sleep related nonobstructive alveolar hypoventilation: Secondary | ICD-10-CM

## 2021-09-06 NOTE — Telephone Encounter (Signed)
I read patient's home sleep test on an urgent basis due to severe sleep apnea detected and severe desaturations.  Please advise patient that I would like for him to get started on AutoPap therapy as soon as possible.  Please expedite set up through a DME company.  Study was read on behalf of Dr. Brett Fairy.  I could not make a result note.  For now, please advise patient to sleep on his sides and with his head of bed slightly elevated about 30 degrees until he gets his AutoPap machine.  Please set up follow-up appointment accordingly as well to see Dr. Brett Fairy or the nurse practitioner.

## 2021-09-06 NOTE — Procedures (Signed)
   [facility]  HOME SLEEP TEST (Watch PAT) REPORT  STUDY DATE: 09/04/2021  DOB: 1986-01-03  MRN: [id number]  ORDERING CLINICIAN: Star Age, MD, PhD   REFERRING CLINICIAN: Shelda Pal, DO   CLINICAL INFORMATION/HISTORY: 36 year old underlying medical history of hypertension, allergies, and obesity, who reports snoring and excessive daytime somnolence.  Epworth sleepiness score: 12/24.  BMI: 32.7 kg/m  FINDINGS:   Sleep Summary:   Total Recording Time (hours, min): 9 hours, 39 minutes  Total Sleep Time (hours, min):  8 hours, 50 minutes   Percent REM (%):    37.9%   Respiratory Indices:   Calculated pAHI (per hour):  33.2/hour         REM pAHI:    39.1/hour       NREM pAHI: 29.7/hour  Oxygen Saturation Statistics:    Oxygen Saturation (%) Mean: 94%   Minimum oxygen saturation (%):                 51%   O2 Saturation Range (%): 51-100%    O2 Saturation (minutes) <=88%: 47.9 min  Pulse Rate Statistics:   Pulse Mean (bpm):    70/min    Pulse Range (45-118/min)   IMPRESSION: OSA (obstructive sleep apnea), severe Nocturnal hypoxemia  RECOMMENDATION:  This home sleep test demonstrates severe obstructive sleep apnea with a total AHI of 33.2/hour and recurrent severe desaturations into the 50s, 60s and 70s with significant O2 nadir of 51% and significant time below or at 88% saturation of nearly 50 minutes for the night.  Snoring was mostly in the moderate range, at times louder, at times milder.  Treatment with positive airway pressure is highly recommended.  I will request an urgent set up on AutoPap therapy.  A laboratory attended titration study can be considered in the future for optimization of his treatment and better tolerance of therapy.  Alternative treatment options are limited secondary to the severity of the patient's sleep disordered breathing but may include inspire, hypoglossal nerve stimulator placement.  Concomitant  weight loss is recommended and for now the patient will be asked to sleep on his sides and with his head of bed slightly elevated until he gets his AutoPap machine. Please note, that untreated obstructive sleep apnea may carry additional perioperative morbidity. Patients with significant obstructive sleep apnea should receive perioperative PAP therapy and the surgeons and particularly the anesthesiologist should be informed of the diagnosis and the severity of the sleep disordered breathing. The patient should be cautioned not to drive, work at heights, or operate dangerous or heavy equipment when tired or sleepy. Review and reiteration of good sleep hygiene measures should be pursued with any patient. Other causes of the patient's symptoms, including circadian rhythm disturbances, an underlying mood disorder, medication effect and/or an underlying medical problem cannot be ruled out based on this test. Clinical correlation is recommended. The patient and his referring provider will be notified of the test results. The patient will be seen in follow up in sleep clinic at [facility].  I certify that I have reviewed the raw data recording prior to the issuance of this report in accordance with the standards of the American Academy of Sleep Medicine (AASM).   INTERPRETING PHYSICIAN:   Star Age, MD, PhD  Board Certified in Neurology and Sleep Medicine  [facility] [address] [place], [place] [address] [phone]

## 2021-09-06 NOTE — Progress Notes (Signed)
See procedure note.

## 2021-09-07 NOTE — Telephone Encounter (Signed)
I called William Long. I advised William Long that Dr. Rexene Alberts reviewed their sleep study results (in Dr Dohmeier's absence) and found that William Long severe sleep apnea. Dr. Rexene Alberts recommends that William Long starts auto CPAP. I reviewed PAP compliance expectations with the William Long. William Long is agreeable to starting a CPAP. I advised William Long that an order will be sent to a DME, [facility], and [facility] will call the William Long within about one week after they file with the William Long's insurance. [facility] will show the William Long how to use the machine, fit for masks, and troubleshoot the CPAP if needed. A follow up appt was made for insurance purposes with Debbora Presto, NP on 10/30/2021 at 8 am. William Long verbalized understanding to arrive 15 minutes early and bring their CPAP. A letter with all of this information in it will be mailed to the William Long as a reminder. I verified with the William Long that the address we have on file is correct. William Long verbalized understanding of results. William Long had no questions at this time but was encouraged to call back if questions arise. I have sent the order to [facility] and have received confirmation that they have received the order.

## 2021-09-21 DIAGNOSIS — G4733 Obstructive sleep apnea (adult) (pediatric): Secondary | ICD-10-CM | POA: Diagnosis not present

## 2021-09-25 NOTE — Telephone Encounter (Signed)
[facility]med Airsense 11 Setup 09/21/21 (appt needed 10/21/21 - 12/22/21)

## 2021-10-16 ENCOUNTER — Encounter: Payer: Self-pay | Admitting: Neurology

## 2021-10-16 ENCOUNTER — Other Ambulatory Visit (HOSPITAL_BASED_OUTPATIENT_CLINIC_OR_DEPARTMENT_OTHER): Payer: Self-pay

## 2021-10-16 ENCOUNTER — Other Ambulatory Visit: Payer: Self-pay | Admitting: Neurology

## 2021-10-16 MED ORDER — AMPHETAMINE-DEXTROAMPHETAMINE 5 MG PO TABS
5.0000 mg | ORAL_TABLET | Freq: Every day | ORAL | 0 refills | Status: DC
  Filled 2021-10-16: qty 30, 30d supply, fill #0

## 2021-10-21 DIAGNOSIS — G4733 Obstructive sleep apnea (adult) (pediatric): Secondary | ICD-10-CM | POA: Diagnosis not present

## 2021-10-26 NOTE — Patient Instructions (Addendum)
Please continue using your CPAP regularly. While your insurance requires that you use CPAP at least 4 hours each night on 70% of the nights, I recommend, that you not skip any nights and use it throughout the night if you can. Getting used to CPAP and staying with the treatment long term does take time and patience and discipline. Untreated obstructive sleep apnea when it is moderate to severe can have an adverse impact on cardiovascular health and raise her risk for heart disease, arrhythmias, hypertension, congestive heart failure, stroke and diabetes. Untreated obstructive sleep apnea causes sleep disruption, nonrestorative sleep, and sleep deprivation. This can have an impact on your day to day functioning and cause daytime sleepiness and impairment of cognitive function, memory loss, mood disturbance, and problems focussing. Using CPAP regularly can improve these symptoms.   I will adjust your maximum pressure from 15 to 17cmH20 and send you for a mask refitting. I am hopeful that this will help get the apneic events down a little lower. Please continue using CPAP every night for at least 4 hours. I will reprint download in about 4 weeks to make sure we are making progress. Please let me know if you need anything!  Continue Adderall as needed  Follow up in 3 months

## 2021-10-26 NOTE — Progress Notes (Signed)
PATIENT: William Long Long DOB: [birth year]  REASON FOR VISIT: follow up HISTORY FROM: patient  Chief Complaint  Patient presents with   Follow-up    RM 1 with son. Last seen 08/21/21. Here for initial cpap f/u. Having air leaks out of mask.      HISTORY OF PRESENT ILLNESS:  10/30/21 ALL:  William Long is a 36 y.o. male here today for follow up for OSA recently started on CPAP. He was seen in follow up with Dr Brett Fairy 08/2021 for excessive sleepiness. HST 09/04/2021 showed severe OSA with AHI of 33.2/hr and recurrent severe desaturations in the 50s,60s and 70s. O2 nadir 51%. Emergent orders placed for autoPAP. He reports doing well on therapy. He has noted significant improvement in energy levels and less daytime sleepiness. His wife states that he is much more energized. He denies difficulty driving. He has noted an air leak. He is using a traditional FFM. He feels facial hair contributes as leak improves when he shaves.   Concerns for narcolepsy evaluated and HLA testing positive. Per Dr Dohmeier's lab note, PSG with MSLT to be ordered. He was given trial of Adderall 58m PRN. He does feel Adderall helps. He does not take it everyday, only on days where he has more drowsiness in the afternoons. He is a sBiochemist, clinical      HISTORY: (copied from Dr Dohmeier's previous note)  William Long is a 36y.o.  male patient seen here as a [facility] .  Chief concern according to patient :  I am sleepy, very sleepy, even when we were dating , I was [place] a [place]  sometimes multiple times- and now falls asleep in conversations and in the midst of a board game.  Every day he gets 6-8 hours of sleep. He needs still to nap, and feels not refreshed. He tries for power naps. He wakes up with headaches, a dull pressure.    I have the pleasure of seeing William Long today, a right -handed Latino male with  a possible sleep disorder and   has a past medical history of CKD (chronic kidney disease) stage 3, Biopsy, GERD, GFR 30-59 ml/min (HCC) and Hypertension. Loud snoring reported by his wife, and he is a back sleeper.    His labs were reviewed, high BUN and high creatinine.     Sleep relevant medical history: Nocturia 1-3 times, wisdom teeth removed-  whiplash injury once. No brain  trauma.    Family medical /sleep history: mother has been EDS and snoring for years.  other family member on CPAP with OSA, insomnia, sleep walkers.  862year old son is a heavy snorer, slight overweight. He is a sleep walker, he is a restless sleeper.    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 Retail .  and lives in a household with wife and 2 young children-   one dog.  Tobacco use: none for 7 years .   ETOH use: less than 6 / week,  Caffeine intake in form of Coffee( 1-2 prn) Soda( has quit) Tea ( chai ) or energy drinks.   Sleep habits are as follows: The patient is sleepy after each meal- avoids breakfast -eats  lunch, The patient's dinner time is between 7- 7.30 PM. The patient goes to bed at 10 PM, sometimes  experiences GERD,  and continues to sleep for 2-3 hours, wakes for 1-3 bathroom breaks, the first time at 3 AM.   The preferred sleep position is supine, with the support of 1 pillow . Dreams are reportedly rare- infrequent/vivid.  6  AM is the usual rise time.  The patient wakes up with an alarm.  He hits snooze several times.  He reports not feeling refreshed or restored in AM, with symptoms such as dry mouth, morning headaches, and residual fatigue.  Naps are taken frequently,  after lunch after dinner- he needs to be woken up-  lasting from 30 to 120 minutes with no effect on nocturnal sleep.    REVIEW OF SYSTEMS: Out of a complete 14 system review of symptoms, the patient complains only of the following symptoms, and all other reviewed systems are  negative.  ESS: 13/24, previously 12/24 FSS 36/63, previously 50/63     10/30/2021    7:58 AM  Results of the Epworth flowsheet  Sitting and reading 2  Watching TV 2  Sitting, inactive in a public place (e.g. a theatre or a meeting) 1  As a passenger in a car for an hour without a break 2  Lying down to rest in the afternoon when circumstances permit 2  Sitting and talking to someone 1  Sitting quietly after a lunch without alcohol 2  In a car, while stopped for a few minutes in traffic 1  Total score 13     ALLERGIES: No Known Allergies  HOME MEDICATIONS: Outpatient Medications Prior to Visit  Medication Sig Dispense Refill   amphetamine-dextroamphetamine (ADDERALL) 5 MG tablet Take 1 tablet (5 mg total) by mouth daily. 30 tablet 0   dapagliflozin propanediol (FARXIGA) 10 MG TABS tablet Take 1 tablet by mouth daily 90 tablet 3   lisinopril (ZESTRIL) 5 MG tablet Take 1 tablet (5 mg total) by mouth daily. 90 tablet 2   levocetirizine (XYZAL) 5 MG tablet Take 1 tablet (5 mg total) by mouth every evening. 30 tablet 2   levocetirizine (XYZAL) 5 MG tablet Take 1 tablet (5 mg total) by mouth every evening. 30 tablet 0   OVER THE COUNTER MEDICATION Take 1 tablet by mouth 2 (two) times a week. Memory multi vitamin     triamcinolone (NASACORT) 55 MCG/ACT AERO nasal inhaler Place 2 sprays into the nose daily. 16.9 mL 12   triamcinolone cream (KENALOG) 0.1 % Apply 1 application topically 2 (two) times daily. 454 g 0   No facility-administered medications prior to visit.    PAST MEDICAL HISTORY: Past Medical History:  Diagnosis Date   CKD (chronic kidney disease) stage 3, GFR 30-59 ml/min (HCC)    Hypertension     PAST SURGICAL HISTORY: Past Surgical History:  Procedure Laterality Date   NO PAST SURGERIES      FAMILY HISTORY: Family History  Problem Relation Age of Onset   Diabetes Maternal Grandfather     SOCIAL HISTORY: Social History   Socioeconomic History    Marital status: Married    Spouse name: Not on file   Number of children: Not on file   Years of education: Not on file   Highest education level: Not on file  Occupational History   Not on file  Tobacco Use   Smoking status: Former   Smokeless tobacco: Former  Scientific laboratory technician Use: Never used  Substance and Sexual Activity   Alcohol use: Not Currently   Drug use: Not Currently  Sexual activity: Yes  Other Topics Concern   Not on file  Social History Narrative   Not on file   Social Determinants of Health   Financial Resource Strain: Low Risk  (10/21/2017)   Overall Financial Resource Strain (CARDIA)    Difficulty of Paying Living Expenses: Not hard at all  Food Insecurity: No Food Insecurity (10/21/2017)   Hunger Vital Sign    Worried About Running Out of Food in the Last Year: Never true    Ran Out of Food in the Last Year: Never true  Transportation Needs: No Transportation Needs (10/21/2017)   PRAPARE - Hydrologist (Medical): No    Lack of Transportation (Non-Medical): No  Physical Activity: Inactive (10/21/2017)   Exercise Vital Sign    Days of Exercise per Week: 0 days    Minutes of Exercise per Session: 0 min  Stress: No Stress Concern Present (10/21/2017)   [place]    Feeling of Stress : Only a little  Social Connections: Unknown (10/21/2017)   Social Connection and Isolation Panel [NHANES]    Frequency of Communication with Friends and Family: Patient refused    Frequency of Social Gatherings with Friends and Family: Patient refused    Attends Religious Services: Patient refused    Active Member of Clubs or Organizations: Patient refused    Attends Archivist Meetings: Patient refused    Marital Status: Patient refused  Intimate Partner Violence: Not At Risk (10/21/2017)   Humiliation, Afraid, Rape, and Kick questionnaire    Fear of Current or Ex-Partner: No     Emotionally Abused: No    Physically Abused: No    Sexually Abused: No     PHYSICAL EXAM  Vitals:   10/30/21 0718  BP: 136/84  Pulse: 68  Weight: 199 lb 3.2 oz (90.4 kg)  Height: _0  (1.651 m)   Body mass index is 33.15 kg/m.  Generalized: Well developed, in no acute distress  Cardiology: normal rate and rhythm, no murmur noted Respiratory: clear to auscultation bilaterally  Neurological examination  Mentation: Alert oriented to time, place, history taking. Follows all commands speech and language fluent Cranial nerve II-XII: Pupils were equal round reactive to light. Extraocular movements were full, visual field were full  Motor: The motor testing reveals 5 over 5 strength of all 4 extremities. Good symmetric motor tone is noted throughout.  Gait and station: Gait is normal.    DIAGNOSTIC DATA (LABS, IMAGING, TESTING) - I reviewed patient records, labs, notes, testing and imaging myself where available.      No data to display           Lab Results  Component Value Date   WBC 5.9 03/31/2021   HGB 14.3 03/31/2021   HCT 42.4 03/31/2021   MCV 85.5 03/31/2021   PLT 267 03/31/2021      Component Value Date/Time   NA 139 04/21/2021 0754   K 4.7 04/21/2021 0754   CL 105 04/21/2021 0754   CO2 25 04/21/2021 0754   GLUCOSE 90 04/21/2021 0754   BUN 30 (H) 04/21/2021 0754   CREATININE 3.04 (H) 04/21/2021 0754   CALCIUM 9.2 04/21/2021 0754   PROT 6.8 02/02/2021 1237   ALBUMIN 4.2 02/02/2021 1237   AST 21 02/02/2021 1237   ALT 21 02/02/2021 1237   ALKPHOS 50 02/02/2021 1237   BILITOT 0.4 02/02/2021 1237   Lab Results  Component Value Date  CHOL 248 (H) 04/21/2021   HDL 39.90 04/21/2021   LDLCALC 177 (H) 04/21/2021   TRIG 155.0 (H) 04/21/2021   CHOLHDL 6 04/21/2021   No results found for: "HGBA1C" No results found for: "VITAMINB12" No results found for: "TSH"   ASSESSMENT AND PLAN 36 y.o. year old male  has a past medical history of CKD (chronic  kidney disease) stage 3, GFR 30-59 ml/min (HCC) and Hypertension. here with     ICD-10-CM   1. Severe obstructive sleep apnea  G47.33 For home use only DME continuous positive airway pressure (CPAP)    2. Nocturnal hypoxemia  G47.34     3. 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 Compliance report reveals excellent daily and acceptable four hour usage. AHI reduced from 32 to 16/hr. Pressure in the 95th percentile of 14.8. I will adjust maximum pressure from 15 to 17cmH20. He has noted an air leak. We will send him for a mask refitting. May consider MSLT in future as HLA testing was positive. He may continue Adderall 73m daily as needed for EDS. He was encouraged to continue using CPAP nightly and for greater than 4 hours each night. We will update supply orders as indicated. Risks of untreated sleep apnea review and education materials provided. Healthy lifestyle habits encouraged. He will follow up in 3 months, sooner if needed. He verbalizes understanding and agreement with this plan.    Orders Placed This Encounter  Procedures   For home use only DME continuous positive airway pressure (CPAP)    Please adjust maximum pressure setting to 17cmH20. Patient also needs mask refitting for air leak.    Order Specific Question:   Length of Need    Answer:   Lifetime    Order Specific Question:   Patient has OSA or probable OSA    Answer:   Yes    Order Specific Question:   Is the patient currently using CPAP in the home    Answer:   Yes    Order Specific Question:   Settings    Answer:   Other see comments    Order Specific Question:   CPAP supplies needed    Answer:   Mask, headgear, cushions, filters, heated tubing and water chamber     No orders of the defined types were placed in this encounter.     ADebbora Presto FNP-C 10/30/2021, 8:03 AM [facility] [address] [place] [phone]

## 2021-10-30 ENCOUNTER — Encounter: Payer: Self-pay | Admitting: Family Medicine

## 2021-10-30 ENCOUNTER — Ambulatory Visit: Payer: BLUE CROSS/BLUE SHIELD | Admitting: Family Medicine

## 2021-10-30 VITALS — BP 136/84 | HR 68 | Ht 65.0 in | Wt 199.2 lb

## 2021-10-30 DIAGNOSIS — G4719 Other hypersomnia: Secondary | ICD-10-CM

## 2021-10-30 DIAGNOSIS — N184 Chronic kidney disease, stage 4 (severe): Secondary | ICD-10-CM | POA: Diagnosis not present

## 2021-10-30 DIAGNOSIS — G4733 Obstructive sleep apnea (adult) (pediatric): Secondary | ICD-10-CM | POA: Diagnosis not present

## 2021-10-30 DIAGNOSIS — G4734 Idiopathic sleep related nonobstructive alveolar hypoventilation: Secondary | ICD-10-CM | POA: Diagnosis not present

## 2021-10-30 DIAGNOSIS — G4753 Recurrent isolated sleep paralysis: Secondary | ICD-10-CM | POA: Diagnosis not present

## 2021-11-06 ENCOUNTER — Other Ambulatory Visit (HOSPITAL_BASED_OUTPATIENT_CLINIC_OR_DEPARTMENT_OTHER): Payer: Self-pay

## 2021-11-06 DIAGNOSIS — N184 Chronic kidney disease, stage 4 (severe): Secondary | ICD-10-CM | POA: Diagnosis not present

## 2021-11-06 DIAGNOSIS — D631 Anemia in chronic kidney disease: Secondary | ICD-10-CM | POA: Diagnosis not present

## 2021-11-06 DIAGNOSIS — R809 Proteinuria, unspecified: Secondary | ICD-10-CM | POA: Diagnosis not present

## 2021-11-06 DIAGNOSIS — I10 Essential (primary) hypertension: Secondary | ICD-10-CM | POA: Diagnosis not present

## 2021-11-06 MED ORDER — LISINOPRIL 10 MG PO TABS
ORAL_TABLET | ORAL | 2 refills | Status: DC
  Filled 2021-11-06: qty 30, 30d supply, fill #0
  Filled 2021-12-13: qty 30, 30d supply, fill #1
  Filled 2022-02-12: qty 30, 30d supply, fill #2

## 2021-11-14 ENCOUNTER — Other Ambulatory Visit (HOSPITAL_COMMUNITY): Payer: Self-pay

## 2021-11-17 ENCOUNTER — Ambulatory Visit (INDEPENDENT_AMBULATORY_CARE_PROVIDER_SITE_OTHER): Payer: BLUE CROSS/BLUE SHIELD | Admitting: Family Medicine

## 2021-11-17 ENCOUNTER — Encounter: Payer: Self-pay | Admitting: Family Medicine

## 2021-11-17 VITALS — BP 108/60 | HR 61 | Temp 98.2°F | Ht 65.0 in | Wt 194.4 lb

## 2021-11-17 DIAGNOSIS — Z0000 Encounter for general adult medical examination without abnormal findings: Secondary | ICD-10-CM

## 2021-11-17 DIAGNOSIS — S46812A Strain of other muscles, fascia and tendons at shoulder and upper arm level, left arm, initial encounter: Secondary | ICD-10-CM | POA: Diagnosis not present

## 2021-11-17 LAB — LIPID PANEL
Cholesterol: 248 mg/dL — ABNORMAL HIGH (ref 0–200)
HDL: 43.2 mg/dL (ref >39.00)
LDL Cholesterol: 181 mg/dL — ABNORMAL HIGH (ref 0–99)
NonHDL: 204.3
Total CHOL/HDL Ratio: 6
Triglycerides: 117 mg/dL (ref 0.0–149.0)
VLDL: 23.4 mg/dL (ref 0.0–40.0)

## 2021-11-17 LAB — COMPREHENSIVE METABOLIC PANEL
ALT: 19 U/L (ref 0–53)
AST: 19 U/L (ref 0–37)
Albumin: 4.4 g/dL (ref 3.5–5.2)
Alkaline Phosphatase: 50 U/L (ref 39–117)
BUN: 40 mg/dL — ABNORMAL HIGH (ref 6–23)
CO2: 25 mEq/L (ref 19–32)
Calcium: 9.3 mg/dL (ref 8.4–10.5)
Chloride: 106 mEq/L (ref 96–112)
Creatinine, Ser: 3.27 mg/dL — ABNORMAL HIGH (ref 0.40–1.50)
GFR: 23.62 mL/min — ABNORMAL LOW (ref >60.00)
Glucose, Bld: 93 mg/dL (ref 70–99)
Potassium: 4.9 mEq/L (ref 3.5–5.1)
Sodium: 139 mEq/L (ref 135–145)
Total Bilirubin: 0.6 mg/dL (ref 0.2–1.2)
Total Protein: 7 g/dL (ref 6.0–8.3)

## 2021-11-17 LAB — CBC
HCT: 43.6 % (ref 39.0–52.0)
Hemoglobin: 14.7 g/dL (ref 13.0–17.0)
MCHC: 33.8 g/dL (ref 30.0–36.0)
MCV: 85 fl (ref 78.0–100.0)
Platelets: 269 10*3/uL (ref 150.0–400.0)
RBC: 5.13 Mil/uL (ref 4.22–5.81)
RDW: 13.8 % (ref 11.5–15.5)
WBC: 6.1 10*3/uL (ref 4.0–10.5)

## 2021-11-17 NOTE — Progress Notes (Signed)
Chief Complaint  Patient presents with   Annual Exam    Left shoulder pain     Well Male William Long is here for a complete physical.   His last physical was >1 year ago.  Current diet: in general, trying to eat healthy.   Current exercise: active at work  Weight trend: intentionally decreasing Fatigue out of ordinary? No. Seat belt? Yes.   Advanced directive? No  Health maintenance Tetanus- Yes HIV- Yes Hep C- Yes  Left shoulder pain 2-3 mo ago, started having. Nml ROM, no bruising, swelling, redness. Worse when he lifts heavier things. Located between shoulder and neck. Tried Biofreeze at home with minimal relief. Not flared now. No neuro s/s's.   Past Medical History:  Diagnosis Date   CKD (chronic kidney disease) stage 3, GFR 30-59 ml/min (HCC)    Hypertension      Past Surgical History:  Procedure Laterality Date   NO PAST SURGERIES      Medications  Current Outpatient Medications on File Prior to Visit  Medication Sig Dispense Refill   amphetamine-dextroamphetamine (ADDERALL) 5 MG tablet Take 1 tablet (5 mg total) by mouth daily. 30 tablet 0   dapagliflozin propanediol (FARXIGA) 10 MG TABS tablet Take 1 tablet by mouth daily 90 tablet 3   lisinopril (ZESTRIL) 10 MG tablet Take 1 tablet by mouth everyday 30 tablet 2   Allergies No Known Allergies  Family History Family History  Problem Relation Age of Onset   Diabetes Maternal Grandfather     Review of Systems: Constitutional: no fevers or chills Eye:  no recent significant change in vision Ear/Nose/Mouth/Throat:  Ears:  no hearing loss Nose/Mouth/Throat:  no complaints of nasal congestion, no sore throat Cardiovascular:  no chest pain Respiratory:  no shortness of breath Gastrointestinal:  no abdominal pain, no change in bowel habits GU:  Male: negative for dysuria Musculoskeletal/Extremities: +L sholuder pain; otherwise no pain of the joints Integumentary (Skin/Breast):  no abnormal skin  lesions reported Neurologic:  no headaches Endocrine: No unexpected weight changes Hematologic/Lymphatic:  no night sweats  Exam BP 108/60   Pulse 61   Temp 98.2 F (36.8 C) (Oral)   Ht 5\' 5"  (1.651 m)   Wt 194 lb 6 oz (88.2 kg)   SpO2 97%   BMI 32.35 kg/m  General:  well developed, well nourished, in no apparent distress Skin:  no significant moles, warts, or growths Head:  no masses, lesions, or tenderness Eyes:  pupils equal and round, sclera anicteric without injection Ears:  canals without lesions, TMs shiny without retraction, no obvious effusion, no erythema Nose:  nares patent, septum midline, mucosa normal Throat/Pharynx:  lips and gingiva without lesion; tongue and uvula midline; non-inflamed pharynx; no exudates or postnasal drainage Neck: neck supple without adenopathy, thyromegaly, or masses Lungs:  clear to auscultation, breath sounds equal bilaterally, no respiratory distress Cardio:  regular rate and rhythm, no bruits, no LE edema Abdomen:  abdomen soft, nontender; bowel sounds normal; no masses or organomegaly Genital (male): Deferred Rectal: Deferred Musculoskeletal: no curent pain over L trap but does not that is where is discomfort is; neg Neer's, Hawkins, empty can, lift off, cross over, Speed's of L shoulder; otherwise symmetrical muscle groups noted without atrophy or deformity Extremities:  no clubbing, cyanosis, or edema, no deformities, no skin discoloration Neuro:  gait normal; deep tendon reflexes normal and symmetric Psych: well oriented with normal range of affect and appropriate judgment/insight  Assessment and Plan  Well adult  exam - Plan: CBC, Comprehensive metabolic panel, Lipid panel  Strain of left trapezius muscle, initial encounter   Well 36 y.o. male. Counseled on diet and exercise. Self testicular exams recommended at least monthly.  Advanced directive form provided today.  L trap strain: Ice, heat, Tylenol, stretches/exercises. PT if  no improvement. Letter for work excusing from heavy lifting or lifting bulky items for the next month.  Other orders as above. Follow up in 1 year pending the above workup. The patient voiced understanding and agreement to the plan.  [place], DO 11/17/21 7:30 AM

## 2021-11-17 NOTE — Patient Instructions (Addendum)
Give William Long 2-3 business days to get the results of your labs back.   Keep the diet clean and stay active.  Do monthly self testicular checks in the shower. You are feeling for lumps/bumps that don't belong. If you feel anything like this, let me know!  Please get me a copy of your advanced directive form at your convenience.   I recommend getting the flu shot in mid October. This suggestion would change if the CDC comes out with a different recommendation.   OK to take Tylenol 1000 mg (2 extra strength tabs) or 975 mg (3 regular strength tabs) every 6 hours as needed.  Heat (pad or rice pillow in microwave) over affected area, 10-15 minutes twice daily.   Ice/cold pack over area for 10-15 min twice daily.  Send me a message in 1 month if not improving and we can get you set up with physical therapy.   Let William Long know if you need anything.  Trapezius stretches/exercises Do exercises exactly as told by your health care provider and adjust them as directed. It is normal to feel mild stretching, pulling, tightness, or discomfort as you do these exercises, but you should stop right away if you feel sudden pain or your pain gets worse.   Stretching and range of motion exercises These exercises warm up your muscles and joints and improve the movement and flexibility of your shoulder. These exercises can also help to relieve pain, numbness, and tingling. If you are unable to do any of the following for any reason, do not further attempt to do it.   Exercise A: Flexion, standing     Stand and hold a broomstick, a cane, or a similar object. Place your hands a little more than shoulder-width apart on the object. Your left / right hand should be palm-up, and your other hand should be palm-down. Push the stick to raise your left / right arm out to your side and then over your head. Use your other hand to help move the stick. Stop when you feel a stretch in your shoulder, or when you reach the angle that is  recommended by your health care provider. Avoid shrugging your shoulder while you raise your arm. Keep your shoulder blade tucked down toward your spine. Hold for 30 seconds. Slowly return to the starting position. Repeat 2 times. Complete this exercise 3 times per week.  Exercise B: Abduction, supine     Lie on your back and hold a broomstick, a cane, or a similar object. Place your hands a little more than shoulder-width apart on the object. Your left / right hand should be palm-up, and your other hand should be palm-down. Push the stick to raise your left / right arm out to your side and then over your head. Use your other hand to help move the stick. Stop when you feel a stretch in your shoulder, or when you reach the angle that is recommended by your health care provider. Avoid shrugging your shoulder while you raise your arm. Keep your shoulder blade tucked down toward your spine. Hold for 30 seconds. Slowly return to the starting position. Repeat 2 times. Complete this exercise 3 times per week.  Exercise C: Flexion, active-assisted     Lie on your back. You may bend your knees for comfort. Hold a broomstick, a cane, or a similar object. Place your hands about shoulder-width apart on the object. Your palms should face toward your feet. Raise the stick and move your  arms over your head and behind your head, toward the floor. Use your healthy arm to help your left / right arm move farther. Stop when you feel a gentle stretch in your shoulder, or when you reach the angle where your health care provider tells you to stop. Hold for 30 seconds. Slowly return to the starting position. Repeat 2 times. Complete this exercise 3 times per week.  Exercise D: External rotation and abduction     Stand in a door frame with one of your feet slightly in front of the other. This is called a staggered stance. Choose one of the following positions as told by your health care provider: Place your  hands and forearms on the door frame above your head. Place your hands and forearms on the door frame at the height of your head. Place your hands on the door frame at the height of your elbows. Slowly move your weight onto your front foot until you feel a stretch across your chest and in the front of your shoulders. Keep your head and chest upright and keep your abdominal muscles tight. Hold for 30 seconds. To release the stretch, shift your weight to your back foot. Repeat 2 times. Complete this stretch 3 times per week.  Strengthening exercises These exercises build strength and endurance in your shoulder. Endurance is the ability to use your muscles for a long time, even after your muscles get tired. Exercise E: Scapular depression and adduction  Sit on a stable chair. Support your arms in front of you with pillows, armrests, or a tabletop. Keep your elbows in line with the sides of your body. Gently move your shoulder blades down toward your middle back. Relax the muscles on the tops of your shoulders and in the back of your neck. Hold for 3 seconds. Slowly release the tension and relax your muscles completely before doing this exercise again. Repeat for a total of 10 repetitions. After you have practiced this exercise, try doing the exercise without the arm support. Then, try the exercise while standing instead of sitting. Repeat 2 times. Complete this exercise 3 times per week.  Exercise F: Shoulder abduction, isometric     Stand or sit about 4-6 inches (10-15 cm) from a wall with your left / right side facing the wall. Bend your left / right elbow and gently press your elbow against the wall. Increase the pressure slowly until you are pressing as hard as you can without shrugging your shoulder. Hold for 3 seconds. Slowly release the tension and relax your muscles completely. Repeat for a total of 10 repetitions. Repeat 2 times. Complete this exercise 3 times per week.  Exercise G:  Shoulder flexion, isometric     Stand or sit about 4-6 inches (10-15 cm) away from a wall with your left / right side facing the wall. Keep your left / right elbow straight and gently press the top of your fist against the wall. Increase the pressure slowly until you are pressing as hard as you can without shrugging your shoulder. Hold for 10-15 seconds. Slowly release the tension and relax your muscles completely. Repeat for a total of 10 repetitions. Repeat 2 times. Complete this exercise 3 times per week.  Exercise H: Internal rotation     Sit in a stable chair without armrests, or stand. Secure an exercise band at your left / right side, at elbow height. Place a soft object, such as a folded towel or a small pillow, under your  left / right upper arm so your elbow is a few inches (about 8 cm) away from your side. Hold the end of the exercise band so the band stretches. Keeping your elbow pressed against the soft object under your arm, move your forearm across your body toward your abdomen. Keep your body steady so the movement is only coming from your shoulder. Hold for 3 seconds. Slowly return to the starting position. Repeat for a total of 10 repetitions. Repeat 2 times. Complete this exercise 3 times per week.  Exercise I: External rotation     Sit in a stable chair without armrests, or stand. Secure an exercise band at your left / right side, at elbow height. Place a soft object, such as a folded towel or a small pillow, under your left / right upper arm so your elbow is a few inches (about 8 cm) away from your side. Hold the end of the exercise band so the band stretches. Keeping your elbow pressed against the soft object under your arm, move your forearm out, away from your abdomen. Keep your body steady so the movement is only coming from your shoulder. Hold for 3 seconds. Slowly return to the starting position. Repeat for a total of 10 repetitions. Repeat 2 times. Complete  this exercise 3 times per week. Exercise J: Shoulder extension  Sit in a stable chair without armrests, or stand. Secure an exercise band to a stable object in front of you so the band is at shoulder height. Hold one end of the exercise band in each hand. Your palms should face each other. Straighten your elbows and lift your hands up to shoulder height. Step back, away from the secured end of the exercise band, until the band stretches. Squeeze your shoulder blades together and pull your hands down to the sides of your thighs. Stop when your hands are straight down by your sides. Do not let your hands go behind your body. Hold for 3 seconds. Slowly return to the starting position. Repeat for a total of 10 repetitions. Repeat 2 times. Complete this exercise 3 times per week.  Exercise K: Shoulder extension, prone     Lie on your abdomen on a firm surface so your left / right arm hangs over the edge. Hold a 5 lb weight in your hand so your palm faces in toward your body. Your arm should be straight. Squeeze your shoulder blade down toward the middle of your back. Slowly raise your arm behind you, up to the height of the surface that you are lying on. Keep your arm straight. Hold for 3 seconds. Slowly return to the starting position and relax your muscles. Repeat for a total of 10 repetitions. Repeat 2 times. Complete this exercise 3 times per week.   Exercise L: Horizontal abduction, prone  Lie on your abdomen on a firm surface so your left / right arm hangs over the edge. Hold a 5 lb weight in your hand so your palm faces toward your feet. Your arm should be straight. Squeeze your shoulder blade down toward the middle of your back. Bend your elbow so your hand moves up, until your elbow is bent to an "L" shape (90 degrees). With your elbow bent, slowly move your forearm forward and up. Raise your hand up to the height of the surface that you are lying on. Your upper arm should not move,  and your elbow should stay bent. At the top of the movement, your palm should  face the floor. Hold for 3 seconds. Slowly return to the starting position and relax your muscles. Repeat for a total of 10 repetitions. Repeat 2 times. Complete this exercise 3 times per week.  Exercise M: Horizontal abduction, standing  Sit on a stable chair, or stand. Secure an exercise band to a stable object in front of you so the band is at shoulder height. Hold one end of the exercise band in each hand. Straighten your elbows and lift your hands straight in front of you, up to shoulder height. Your palms should face down, toward the floor. Step back, away from the secured end of the exercise band, until the band stretches. Move your arms out to your sides, and keep your arms straight. Hold for 3 seconds. Slowly return to the starting position. Repeat for a total of 10 repetitions. Repeat 2 times. Complete this exercise 3 times per week.  Exercise N: Scapular retraction and elevation  Sit on a stable chair, or stand. Secure an exercise band to a stable object in front of you so the band is at shoulder height. Hold one end of the exercise band in each hand. Your palms should face each other. Sit in a stable chair without armrests, or stand. Step back, away from the secured end of the exercise band, until the band stretches. Squeeze your shoulder blades together and lift your hands over your head. Keep your elbows straight. Hold for 3 seconds. Slowly return to the starting position. Repeat for a total of 10 repetitions. Repeat 2 times. Complete this exercise 3 times per week.  This information is not intended to replace advice given to you by your health care provider. Make sure you discuss any questions you have with your health care provider. Document Released: 04/02/2005 Document Revised: 12/08/2015 Document Reviewed: 02/17/2015 Elsevier Interactive Patient Education  2017 [employer].

## 2021-11-21 DIAGNOSIS — G4733 Obstructive sleep apnea (adult) (pediatric): Secondary | ICD-10-CM | POA: Diagnosis not present

## 2021-11-23 ENCOUNTER — Other Ambulatory Visit (HOSPITAL_BASED_OUTPATIENT_CLINIC_OR_DEPARTMENT_OTHER): Payer: Self-pay

## 2021-11-23 ENCOUNTER — Other Ambulatory Visit: Payer: Self-pay | Admitting: Neurology

## 2021-11-24 ENCOUNTER — Other Ambulatory Visit (HOSPITAL_BASED_OUTPATIENT_CLINIC_OR_DEPARTMENT_OTHER): Payer: Self-pay

## 2021-11-28 MED ORDER — AMPHETAMINE-DEXTROAMPHETAMINE 5 MG PO TABS
5.0000 mg | ORAL_TABLET | Freq: Every day | ORAL | 0 refills | Status: DC
Start: 2021-11-28 — End: 2022-01-03
  Filled 2021-11-28: qty 30, 30d supply, fill #0

## 2021-11-28 NOTE — Telephone Encounter (Signed)
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

## 2021-11-29 ENCOUNTER — Encounter: Payer: Self-pay | Admitting: Family Medicine

## 2021-11-29 ENCOUNTER — Other Ambulatory Visit (HOSPITAL_BASED_OUTPATIENT_CLINIC_OR_DEPARTMENT_OTHER): Payer: Self-pay

## 2021-12-13 ENCOUNTER — Other Ambulatory Visit (HOSPITAL_BASED_OUTPATIENT_CLINIC_OR_DEPARTMENT_OTHER): Payer: Self-pay

## 2021-12-22 DIAGNOSIS — G4733 Obstructive sleep apnea (adult) (pediatric): Secondary | ICD-10-CM | POA: Diagnosis not present

## 2022-01-03 ENCOUNTER — Other Ambulatory Visit (HOSPITAL_BASED_OUTPATIENT_CLINIC_OR_DEPARTMENT_OTHER): Payer: Self-pay

## 2022-01-03 ENCOUNTER — Other Ambulatory Visit: Payer: Self-pay | Admitting: Neurology

## 2022-01-04 ENCOUNTER — Other Ambulatory Visit (HOSPITAL_BASED_OUTPATIENT_CLINIC_OR_DEPARTMENT_OTHER): Payer: Self-pay

## 2022-01-04 MED ORDER — AMPHETAMINE-DEXTROAMPHETAMINE 5 MG PO TABS
5.0000 mg | ORAL_TABLET | Freq: Every day | ORAL | 0 refills | Status: DC
Start: 2022-01-04 — End: 2022-02-12
  Filled 2022-01-04: qty 30, 30d supply, fill #0

## 2022-01-04 NOTE — Telephone Encounter (Signed)
Pt is updated with appts and registry was checked.

## 2022-01-21 DIAGNOSIS — G4733 Obstructive sleep apnea (adult) (pediatric): Secondary | ICD-10-CM | POA: Diagnosis not present

## 2022-02-06 DIAGNOSIS — N184 Chronic kidney disease, stage 4 (severe): Secondary | ICD-10-CM | POA: Diagnosis not present

## 2022-02-06 DIAGNOSIS — I10 Essential (primary) hypertension: Secondary | ICD-10-CM | POA: Diagnosis not present

## 2022-02-06 DIAGNOSIS — R809 Proteinuria, unspecified: Secondary | ICD-10-CM | POA: Diagnosis not present

## 2022-02-06 DIAGNOSIS — D631 Anemia in chronic kidney disease: Secondary | ICD-10-CM | POA: Diagnosis not present

## 2022-02-12 ENCOUNTER — Other Ambulatory Visit: Payer: Self-pay | Admitting: Neurology

## 2022-02-12 ENCOUNTER — Other Ambulatory Visit (HOSPITAL_BASED_OUTPATIENT_CLINIC_OR_DEPARTMENT_OTHER): Payer: Self-pay

## 2022-02-12 MED ORDER — AMPHETAMINE-DEXTROAMPHETAMINE 5 MG PO TABS
5.0000 mg | ORAL_TABLET | Freq: Every day | ORAL | 0 refills | Status: DC
  Filled 2022-02-12: qty 30, 30d supply, fill #0

## 2022-02-21 DIAGNOSIS — G4733 Obstructive sleep apnea (adult) (pediatric): Secondary | ICD-10-CM | POA: Diagnosis not present

## 2022-03-30 ENCOUNTER — Other Ambulatory Visit (HOSPITAL_BASED_OUTPATIENT_CLINIC_OR_DEPARTMENT_OTHER): Payer: Self-pay

## 2022-03-30 ENCOUNTER — Other Ambulatory Visit: Payer: Self-pay | Admitting: Neurology

## 2022-03-30 MED ORDER — LISINOPRIL 10 MG PO TABS
10.0000 mg | ORAL_TABLET | Freq: Every day | ORAL | 5 refills | Status: DC
  Filled 2022-03-30: qty 30, 30d supply, fill #0
  Filled 2022-05-02: qty 30, 30d supply, fill #1

## 2022-04-02 ENCOUNTER — Other Ambulatory Visit (HOSPITAL_BASED_OUTPATIENT_CLINIC_OR_DEPARTMENT_OTHER): Payer: Self-pay

## 2022-04-02 MED ORDER — AMPHETAMINE-DEXTROAMPHETAMINE 5 MG PO TABS
5.0000 mg | ORAL_TABLET | Freq: Every day | ORAL | 0 refills | Status: DC
  Filled 2022-04-02: qty 30, 30d supply, fill #0

## 2022-04-19 DIAGNOSIS — N184 Chronic kidney disease, stage 4 (severe): Secondary | ICD-10-CM | POA: Diagnosis not present

## 2022-05-02 ENCOUNTER — Other Ambulatory Visit: Payer: Self-pay | Admitting: Neurology

## 2022-05-02 ENCOUNTER — Other Ambulatory Visit (HOSPITAL_BASED_OUTPATIENT_CLINIC_OR_DEPARTMENT_OTHER): Payer: Self-pay

## 2022-05-03 ENCOUNTER — Other Ambulatory Visit (HOSPITAL_BASED_OUTPATIENT_CLINIC_OR_DEPARTMENT_OTHER): Payer: Self-pay

## 2022-05-03 ENCOUNTER — Other Ambulatory Visit: Payer: Self-pay | Admitting: Neurology

## 2022-05-03 DIAGNOSIS — N051 Unspecified nephritic syndrome with focal and segmental glomerular lesions: Secondary | ICD-10-CM | POA: Diagnosis not present

## 2022-05-03 DIAGNOSIS — D631 Anemia in chronic kidney disease: Secondary | ICD-10-CM | POA: Diagnosis not present

## 2022-05-03 DIAGNOSIS — N184 Chronic kidney disease, stage 4 (severe): Secondary | ICD-10-CM | POA: Diagnosis not present

## 2022-05-03 DIAGNOSIS — I10 Essential (primary) hypertension: Secondary | ICD-10-CM | POA: Diagnosis not present

## 2022-05-03 NOTE — Telephone Encounter (Signed)
Not my patient, I think I refill it because I was on call MD that day. ,

## 2022-05-04 ENCOUNTER — Other Ambulatory Visit (HOSPITAL_BASED_OUTPATIENT_CLINIC_OR_DEPARTMENT_OTHER): Payer: Self-pay

## 2022-05-10 ENCOUNTER — Other Ambulatory Visit (HOSPITAL_BASED_OUTPATIENT_CLINIC_OR_DEPARTMENT_OTHER): Payer: Self-pay

## 2022-05-10 MED ORDER — AMPHETAMINE-DEXTROAMPHETAMINE 5 MG PO TABS
5.0000 mg | ORAL_TABLET | Freq: Every day | ORAL | 0 refills | Status: DC
  Filled 2022-05-10: qty 30, 30d supply, fill #0

## 2022-05-15 ENCOUNTER — Other Ambulatory Visit (HOSPITAL_BASED_OUTPATIENT_CLINIC_OR_DEPARTMENT_OTHER): Payer: Self-pay

## 2022-05-17 ENCOUNTER — Other Ambulatory Visit: Payer: Self-pay

## 2022-06-12 ENCOUNTER — Other Ambulatory Visit (HOSPITAL_BASED_OUTPATIENT_CLINIC_OR_DEPARTMENT_OTHER): Payer: Self-pay

## 2022-06-12 ENCOUNTER — Other Ambulatory Visit: Payer: Self-pay | Admitting: Neurology

## 2022-06-13 ENCOUNTER — Other Ambulatory Visit (HOSPITAL_BASED_OUTPATIENT_CLINIC_OR_DEPARTMENT_OTHER): Payer: Self-pay

## 2022-06-13 MED ORDER — AMPHETAMINE-DEXTROAMPHETAMINE 5 MG PO TABS
5.0000 mg | ORAL_TABLET | Freq: Every day | ORAL | 0 refills | Status: DC
Start: 2022-06-13 — End: 2022-11-21
  Filled 2022-06-13: qty 30, 30d supply, fill #0

## 2022-07-24 ENCOUNTER — Other Ambulatory Visit (HOSPITAL_BASED_OUTPATIENT_CLINIC_OR_DEPARTMENT_OTHER): Payer: Self-pay

## 2022-07-24 ENCOUNTER — Other Ambulatory Visit: Payer: Self-pay | Admitting: Neurology

## 2022-07-25 ENCOUNTER — Other Ambulatory Visit (HOSPITAL_BASED_OUTPATIENT_CLINIC_OR_DEPARTMENT_OTHER): Payer: Self-pay

## 2022-07-25 ENCOUNTER — Encounter (HOSPITAL_BASED_OUTPATIENT_CLINIC_OR_DEPARTMENT_OTHER): Payer: Self-pay

## 2022-08-13 ENCOUNTER — Other Ambulatory Visit (HOSPITAL_BASED_OUTPATIENT_CLINIC_OR_DEPARTMENT_OTHER): Payer: Self-pay

## 2022-08-27 DIAGNOSIS — N1832 Chronic kidney disease, stage 3b: Secondary | ICD-10-CM | POA: Diagnosis not present

## 2022-09-03 ENCOUNTER — Other Ambulatory Visit (HOSPITAL_BASED_OUTPATIENT_CLINIC_OR_DEPARTMENT_OTHER): Payer: Self-pay

## 2022-09-03 DIAGNOSIS — N184 Chronic kidney disease, stage 4 (severe): Secondary | ICD-10-CM | POA: Diagnosis not present

## 2022-09-03 DIAGNOSIS — N051 Unspecified nephritic syndrome with focal and segmental glomerular lesions: Secondary | ICD-10-CM | POA: Diagnosis not present

## 2022-09-03 DIAGNOSIS — D631 Anemia in chronic kidney disease: Secondary | ICD-10-CM | POA: Diagnosis not present

## 2022-09-03 DIAGNOSIS — I10 Essential (primary) hypertension: Secondary | ICD-10-CM | POA: Diagnosis not present

## 2022-09-03 MED ORDER — LISINOPRIL 10 MG PO TABS
10.0000 mg | ORAL_TABLET | Freq: Every day | ORAL | 5 refills | Status: DC
  Filled 2022-09-03: qty 30, 30d supply, fill #0

## 2022-09-11 ENCOUNTER — Other Ambulatory Visit (HOSPITAL_BASED_OUTPATIENT_CLINIC_OR_DEPARTMENT_OTHER): Payer: Self-pay

## 2022-10-02 ENCOUNTER — Other Ambulatory Visit (HOSPITAL_BASED_OUTPATIENT_CLINIC_OR_DEPARTMENT_OTHER): Payer: Self-pay

## 2022-10-11 ENCOUNTER — Other Ambulatory Visit (HOSPITAL_BASED_OUTPATIENT_CLINIC_OR_DEPARTMENT_OTHER): Payer: Self-pay

## 2022-10-26 ENCOUNTER — Other Ambulatory Visit (HOSPITAL_BASED_OUTPATIENT_CLINIC_OR_DEPARTMENT_OTHER): Payer: Self-pay

## 2022-10-26 MED ORDER — DAPAGLIFLOZIN PROPANEDIOL 10 MG PO TABS
10.0000 mg | ORAL_TABLET | Freq: Every day | ORAL | 3 refills | Status: DC
  Filled 2022-10-26: qty 30, 30d supply, fill #0
  Filled 2022-12-04: qty 30, 30d supply, fill #1
  Filled 2023-01-24 – 2023-02-12 (×2): qty 30, 30d supply, fill #2
  Filled 2023-04-08: qty 30, 30d supply, fill #3
  Filled 2023-06-08: qty 30, 30d supply, fill #4
  Filled 2023-08-21: qty 30, 30d supply, fill #5
  Filled 2023-10-16: qty 30, 30d supply, fill #6

## 2022-11-21 ENCOUNTER — Encounter: Payer: Self-pay | Admitting: Family Medicine

## 2022-11-21 ENCOUNTER — Other Ambulatory Visit: Payer: Self-pay | Admitting: Family Medicine

## 2022-11-21 ENCOUNTER — Other Ambulatory Visit (HOSPITAL_BASED_OUTPATIENT_CLINIC_OR_DEPARTMENT_OTHER): Payer: Self-pay

## 2022-11-21 ENCOUNTER — Ambulatory Visit (INDEPENDENT_AMBULATORY_CARE_PROVIDER_SITE_OTHER): Payer: BLUE CROSS/BLUE SHIELD | Admitting: Family Medicine

## 2022-11-21 VITALS — BP 128/80 | HR 53 | Temp 98.9°F | Ht 65.0 in | Wt 187.5 lb

## 2022-11-21 DIAGNOSIS — R058 Other specified cough: Secondary | ICD-10-CM

## 2022-11-21 DIAGNOSIS — M79675 Pain in left toe(s): Secondary | ICD-10-CM

## 2022-11-21 DIAGNOSIS — T464X5A Adverse effect of angiotensin-converting-enzyme inhibitors, initial encounter: Secondary | ICD-10-CM | POA: Diagnosis not present

## 2022-11-21 DIAGNOSIS — Z0000 Encounter for general adult medical examination without abnormal findings: Secondary | ICD-10-CM

## 2022-11-21 LAB — COMPREHENSIVE METABOLIC PANEL
ALT: 17 U/L (ref 0–53)
AST: 20 U/L (ref 0–37)
Albumin: 4.2 g/dL (ref 3.5–5.2)
Alkaline Phosphatase: 45 U/L (ref 39–117)
BUN: 31 mg/dL — ABNORMAL HIGH (ref 6–23)
CO2: 24 mEq/L (ref 19–32)
Calcium: 9 mg/dL (ref 8.4–10.5)
Chloride: 108 mEq/L (ref 96–112)
Creatinine, Ser: 3.04 mg/dL — ABNORMAL HIGH (ref 0.40–1.50)
GFR: 25.59 mL/min — ABNORMAL LOW (ref >60.00)
Glucose, Bld: 98 mg/dL (ref 70–99)
Potassium: 4.1 mEq/L (ref 3.5–5.1)
Sodium: 140 mEq/L (ref 135–145)
Total Bilirubin: 0.5 mg/dL (ref 0.2–1.2)
Total Protein: 6.8 g/dL (ref 6.0–8.3)

## 2022-11-21 LAB — URIC ACID: Uric Acid, Serum: 8.3 mg/dL — ABNORMAL HIGH (ref 4.0–7.8)

## 2022-11-21 LAB — LIPID PANEL
Cholesterol: 238 mg/dL — ABNORMAL HIGH (ref 0–200)
HDL: 49.5 mg/dL (ref >39.00)
LDL Cholesterol: 164 mg/dL — ABNORMAL HIGH (ref 0–99)
NonHDL: 188.87
Total CHOL/HDL Ratio: 5
Triglycerides: 122 mg/dL (ref 0.0–149.0)
VLDL: 24.4 mg/dL (ref 0.0–40.0)

## 2022-11-21 LAB — CBC
HCT: 43.8 % (ref 39.0–52.0)
Hemoglobin: 14.2 g/dL (ref 13.0–17.0)
MCHC: 32.5 g/dL (ref 30.0–36.0)
MCV: 84.2 fl (ref 78.0–100.0)
Platelets: 259 10*3/uL (ref 150.0–400.0)
RBC: 5.21 Mil/uL (ref 4.22–5.81)
RDW: 14.9 % (ref 11.5–15.5)
WBC: 5.7 10*3/uL (ref 4.0–10.5)

## 2022-11-21 MED ORDER — PREDNISONE 20 MG PO TABS
40.0000 mg | ORAL_TABLET | Freq: Every day | ORAL | 0 refills | Status: AC
  Filled 2022-11-21: qty 10, 5d supply, fill #0

## 2022-11-21 MED ORDER — VALSARTAN 80 MG PO TABS
80.0000 mg | ORAL_TABLET | Freq: Every day | ORAL | 5 refills | Status: DC
  Filled 2022-11-21 – 2022-12-04 (×2): qty 30, 30d supply, fill #0
  Filled 2023-04-08: qty 30, 30d supply, fill #1
  Filled 2023-06-08: qty 30, 30d supply, fill #2
  Filled 2023-10-16: qty 30, 30d supply, fill #3

## 2022-11-21 NOTE — Progress Notes (Signed)
Chief Complaint  Patient presents with   Annual Exam   Cough    For one over one month    Well Male William Long is here for a complete physical.   His last physical was >1 year ago.  Current diet: in general, a "healthy" diet.   Current exercise: cycling, active at work Weight trend: intentionally losing Fatigue out of ordinary? No. Seat belt? Yes.   Advanced directive? No  Health maintenance Tetanus- Yes HIV- Yes Hep C- Yes  Cough The patient has had a cough for the past 6 weeks.  He has no other associated symptoms.  The cough is dry.  No shortness of breath, wheezing, sore throat, runny/stuffy nose, sinus pain, itchy/watery eyes, ear pain/drainage, fevers, or bodyaches.  No sick contacts leading into this.  He is taking lisinopril.  No history of reflux, COPD, asthma.  Left great toe pain for several weeks.  No injury or change in activity.  He did change shoes prior to this starting.  He denies any swelling, bruising, or redness.  No association with meals.  He has not tried thing at home so far.  The pain comes and goes.  Past Medical History:  Diagnosis Date   CKD (chronic kidney disease) stage 3, GFR 30-59 ml/min (HCC)    Hypertension      Past Surgical History:  Procedure Laterality Date   NO PAST SURGERIES      Medications  Current Outpatient Medications on File Prior to Visit  Medication Sig Dispense Refill   dapagliflozin propanediol (FARXIGA) 10 MG TABS tablet Take 1 tablet (10 mg total) by mouth daily. 90 tablet 3   Allergies No Known Allergies  Family History Family History  Problem Relation Age of Onset   Diabetes Maternal Grandfather     Review of Systems: Constitutional: no fevers or chills Eye:  no recent significant change in vision Ear/Nose/Mouth/Throat:  Ears:  no hearing loss Nose/Mouth/Throat:  no complaints of nasal congestion, no sore throat Cardiovascular:  no chest pain Respiratory:  no shortness of breath, +  cough Gastrointestinal:  no abdominal pain, no change in bowel habits GU:  Male: negative for dysuria Musculoskeletal/Extremities:  + Toe pain Integumentary (Skin/Breast):  no abnormal skin lesions reported Neurologic:  no headaches Endocrine: No unexpected weight changes Hematologic/Lymphatic:  no night sweats  Exam BP 128/80 (BP Location: Left Arm, Patient Position: Sitting, Cuff Size: Normal)   Pulse (!) 53   Temp 98.9 F (37.2 C) (Oral)   Ht 5\' 5"  (1.651 m)   Wt 187 lb 8 oz (85 kg)   SpO2 99%   BMI 31.20 kg/m  General:  well developed, well nourished, in no apparent distress Skin:  no significant moles, warts, or growths Head:  no masses, lesions, or tenderness Eyes:  pupils equal and round, sclera anicteric without injection Ears:  canals without lesions, TMs shiny without retraction, no obvious effusion, no erythema Nose:  nares patent, mucosa normal Throat/Pharynx:  lips and gingiva without lesion; tongue and uvula midline; non-inflamed pharynx; no exudates or postnasal drainage Neck: neck supple without adenopathy, thyromegaly, or masses Lungs:  clear to auscultation, breath sounds equal bilaterally, no respiratory distress Cardio: Bradycardic, regular rhythm, no bruits, no LE edema Abdomen:  abdomen soft, nontender; bowel sounds normal; no masses or organomegaly Genital (male): Deferred Rectal: Deferred Musculoskeletal: TTP over the IP joint of the first digit of the left foot.  No deformity, excessive warmth, erythema, ecchymosis.  Symmetrical muscle groups noted without  atrophy or deformity Extremities:  no clubbing, cyanosis, or edema, no deformities, no skin discoloration Neuro:  gait normal; deep tendon reflexes normal and symmetric Psych: well oriented with normal range of affect and appropriate judgment/insight  Assessment and Plan  Well adult exam - Plan: CBC, Comprehensive metabolic panel, Lipid panel  Cough due to ACE inhibitor  Toe pain, left - Plan:  Ambulatory referral to Sports Medicine, Uric acid   Well 37 y.o. male. Counseled on diet and exercise. Self testicular exams recommended at least monthly.  Advanced directive form provided today.  Cough: Adverse effect of chronic med. Will change lisinopril to valsartan 80 mg/d. F/u in 4-6 weeks if no better.  Toe pain: Will rule out gout with a uric acid level.  Refer to sports medicine for further evaluation.  Wear supportive shoes in the meanwhile.  Tylenol, ice, avoid NSAIDs. Other orders as above. Follow up in 1 year pending the above workup. The patient voiced understanding and agreement to the plan.  Jilda Roche [place], DO 11/21/22 9:04 AM

## 2022-11-21 NOTE — Patient Instructions (Addendum)
Give [place] 2-3 business days to get the results of your labs back.   Keep the diet clean and stay active.  Do monthly self testicular checks in the shower. You are feeling for lumps/bumps that don't belong. If you feel anything like this, let me know!  I recommend getting the flu shot in mid October. This suggestion would change if the CDC comes out with a different recommendation.   Aim to do some physical exertion for 150 minutes per week. This is typically divided into 5 days per week, 30 minutes per day. The activity should be enough to get your heart rate up. Anything is better than nothing if you have time constraints.  Please get me a copy of your advanced directive form at your convenience.   If the cough is not getting better in the next 4-6 weeks, please follow up to dig a little deeper.   If you do not hear anything about your referral in the next 1-2 weeks, call our office and ask for an update.  Let us know if you need anything.

## 2022-11-30 ENCOUNTER — Other Ambulatory Visit (HOSPITAL_BASED_OUTPATIENT_CLINIC_OR_DEPARTMENT_OTHER): Payer: Self-pay

## 2022-12-04 ENCOUNTER — Other Ambulatory Visit (HOSPITAL_BASED_OUTPATIENT_CLINIC_OR_DEPARTMENT_OTHER): Payer: Self-pay

## 2023-01-10 DIAGNOSIS — N184 Chronic kidney disease, stage 4 (severe): Secondary | ICD-10-CM | POA: Diagnosis not present

## 2023-01-17 DIAGNOSIS — I10 Essential (primary) hypertension: Secondary | ICD-10-CM | POA: Diagnosis not present

## 2023-01-17 DIAGNOSIS — N184 Chronic kidney disease, stage 4 (severe): Secondary | ICD-10-CM | POA: Diagnosis not present

## 2023-01-17 DIAGNOSIS — N051 Unspecified nephritic syndrome with focal and segmental glomerular lesions: Secondary | ICD-10-CM | POA: Diagnosis not present

## 2023-01-17 DIAGNOSIS — D631 Anemia in chronic kidney disease: Secondary | ICD-10-CM | POA: Diagnosis not present

## 2023-01-24 ENCOUNTER — Other Ambulatory Visit (HOSPITAL_BASED_OUTPATIENT_CLINIC_OR_DEPARTMENT_OTHER): Payer: Self-pay

## 2023-02-07 ENCOUNTER — Other Ambulatory Visit (HOSPITAL_BASED_OUTPATIENT_CLINIC_OR_DEPARTMENT_OTHER): Payer: Self-pay

## 2023-02-12 ENCOUNTER — Other Ambulatory Visit (HOSPITAL_BASED_OUTPATIENT_CLINIC_OR_DEPARTMENT_OTHER): Payer: Self-pay

## 2023-02-13 ENCOUNTER — Other Ambulatory Visit (HOSPITAL_BASED_OUTPATIENT_CLINIC_OR_DEPARTMENT_OTHER): Payer: Self-pay

## 2023-02-13 MED ORDER — INFLUENZA VIRUS VACC SPLIT PF (FLUZONE) 0.5 ML IM SUSY
0.5000 mL | PREFILLED_SYRINGE | Freq: Once | INTRAMUSCULAR | 0 refills | Status: AC
  Filled 2023-02-13: qty 0.5, 1d supply, fill #0

## 2023-04-08 ENCOUNTER — Other Ambulatory Visit (HOSPITAL_BASED_OUTPATIENT_CLINIC_OR_DEPARTMENT_OTHER): Payer: Self-pay

## 2023-04-22 DIAGNOSIS — N184 Chronic kidney disease, stage 4 (severe): Secondary | ICD-10-CM | POA: Diagnosis not present

## 2023-04-23 DIAGNOSIS — R809 Proteinuria, unspecified: Secondary | ICD-10-CM | POA: Diagnosis not present

## 2023-04-23 DIAGNOSIS — I10 Essential (primary) hypertension: Secondary | ICD-10-CM | POA: Diagnosis not present

## 2023-04-23 DIAGNOSIS — D631 Anemia in chronic kidney disease: Secondary | ICD-10-CM | POA: Diagnosis not present

## 2023-04-23 DIAGNOSIS — N184 Chronic kidney disease, stage 4 (severe): Secondary | ICD-10-CM | POA: Diagnosis not present

## 2023-05-14 ENCOUNTER — Other Ambulatory Visit (HOSPITAL_BASED_OUTPATIENT_CLINIC_OR_DEPARTMENT_OTHER): Payer: Self-pay

## 2023-05-19 IMAGING — US US BIOPSY
1 series · 10 of 10 positions shown · non-contrast
Comparison: 01/03/2018

INDICATION: 35-year-old male with chronic kidney disease.

EXAM:
ULTRASOUND GUIDED RENAL BIOPSY

[Series 1: us biopsy (kidney) · 10 of 10 slices shown]
[im 1/10]
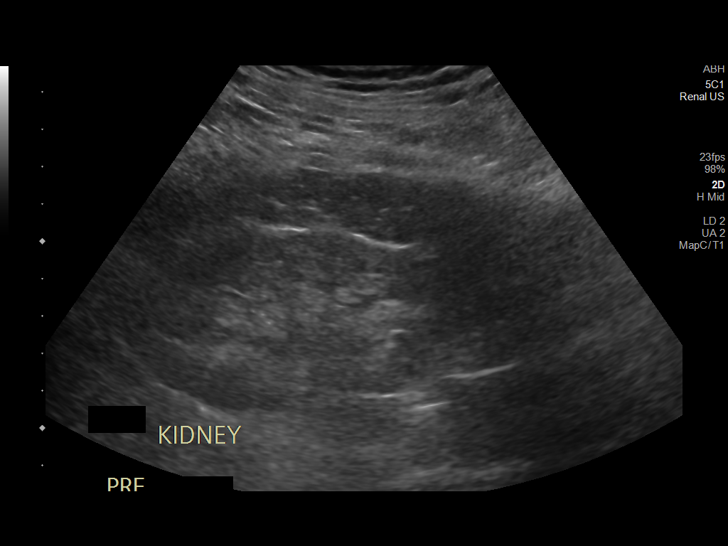
[im 2/10]
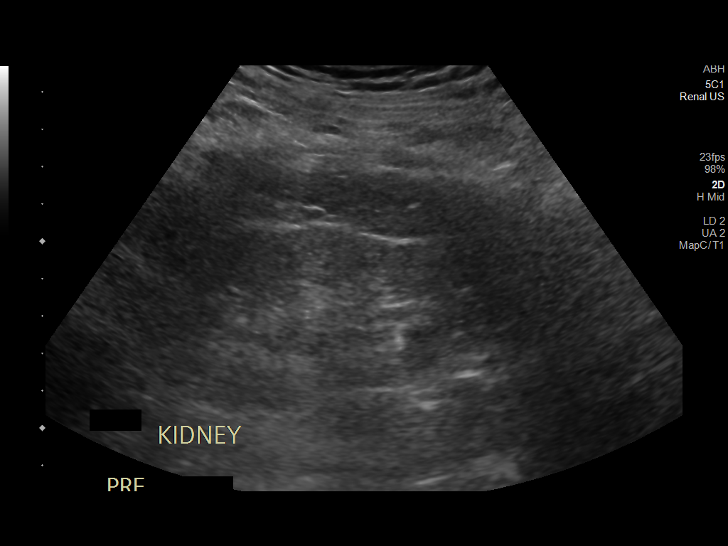
[im 3/10]
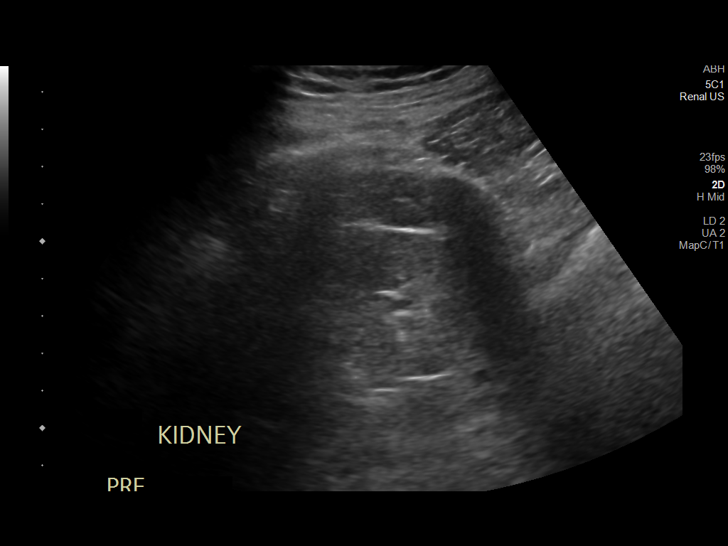
[im 4/10]
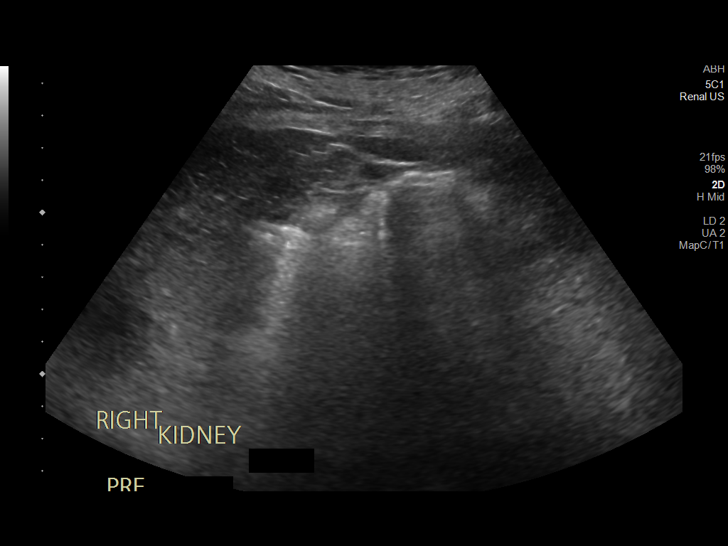
[im 5/10]
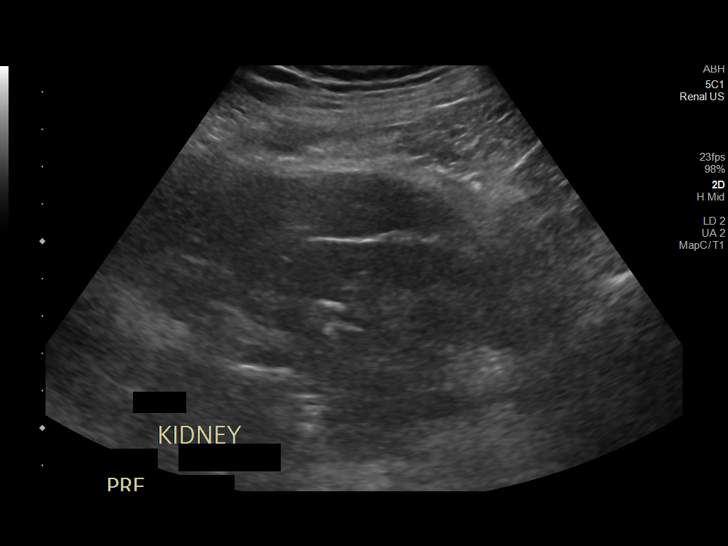
[im 6/10]
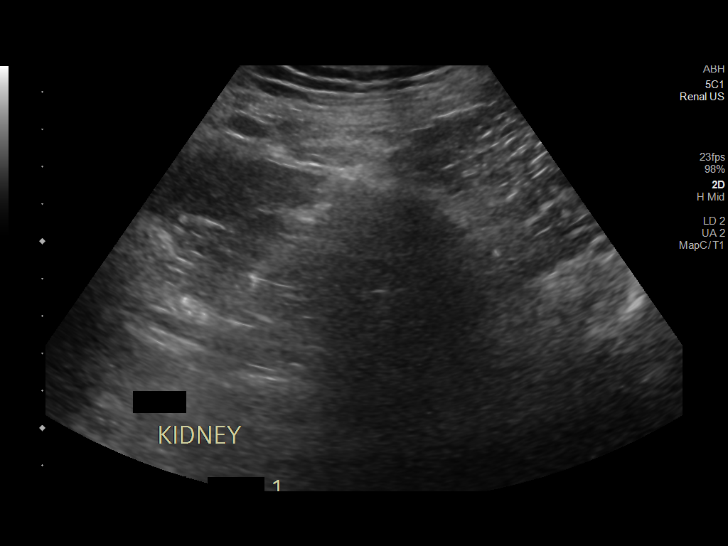
[im 7/10]
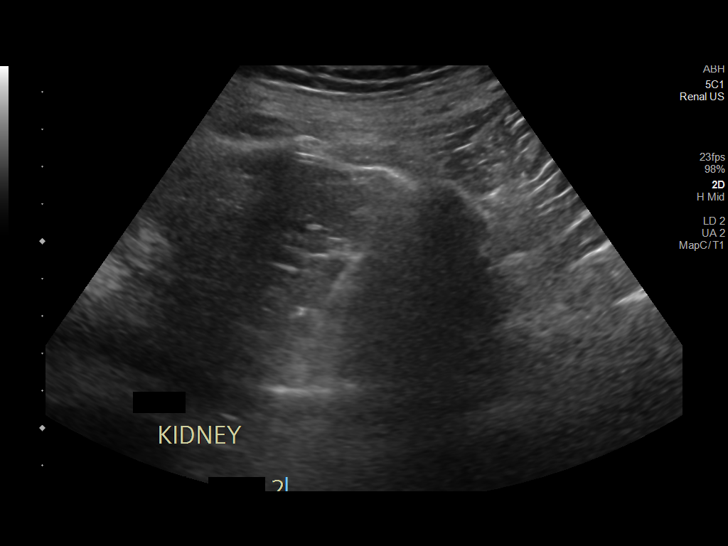
[im 8/10]
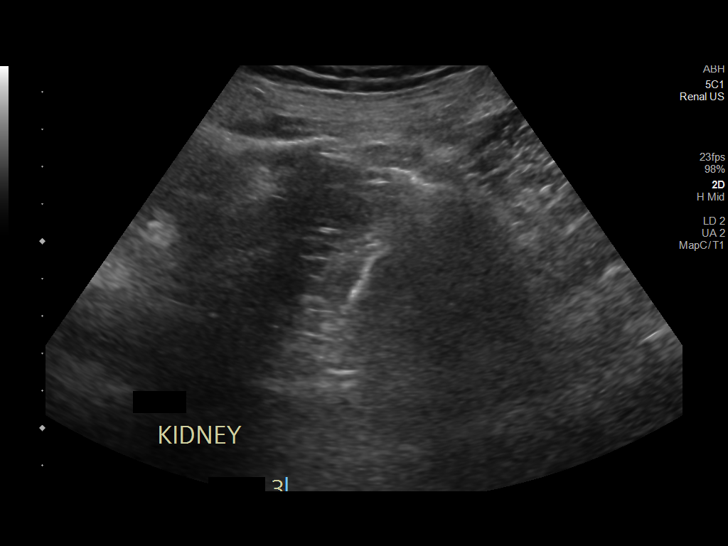
[im 9/10]
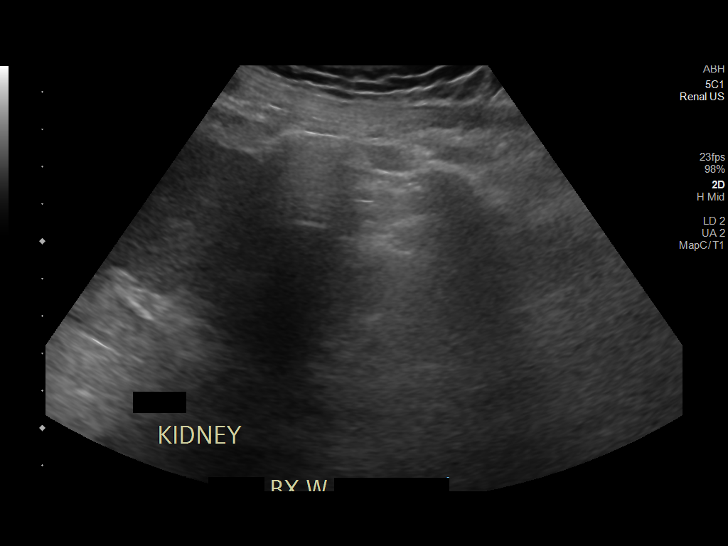
[im 10/10]
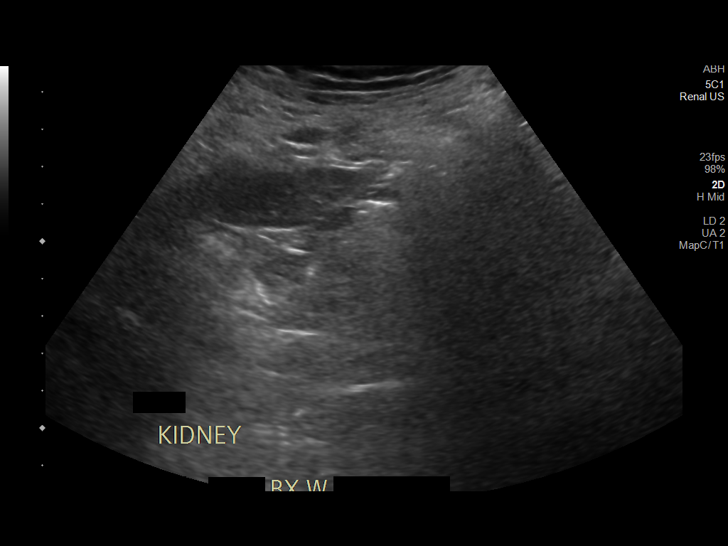

[10 of 10 positions shown; findings below may reference images not displayed]

MEDICATIONS:
None.

ANESTHESIA/SEDATION:
Fentanyl 50 mcg IV; Versed 1 mg IV

Total Moderate Sedation time: 14 minutes; The patient was
continuously monitored during the procedure by the interventional
radiology nurse under my direct supervision.

COMPLICATIONS:
None immediate.

PROCEDURE:
Informed written consent was obtained from the patient after a
discussion of the risks, benefits and alternatives to treatment. The
patient understands and consents the procedure. A timeout was
performed prior to the initiation of the procedure.

Ultrasound scanning was performed of the bilateral flanks. The
inferior pole of the left kidney was selected for biopsy due to
location and sonographic window. The procedure was planned. The
operative site was prepped and draped in the usual sterile fashion.
The overlying soft tissues were anesthetized with 1% lidocaine with
epinephrine. A 15 gauge introducer needle was advanced into the
inferior cortex of the left kidney and 3 core biopsies were obtained
under direct ultrasound guidance with a 16 gauge core biopsy needle.
Images were saved for documentation purposes. The biopsy device was
removed while injecting Gel-Foam slurry under ultrasound guidance.
Hemostasis was obtained with manual compression. Post procedural
scanning was negative for significant post procedural hemorrhage or
additional complication. A dressing was placed. The patient
tolerated the procedure well without immediate post procedural
complication.
IMPRESSION: Technically successful ultrasound guided left renal biopsy.

## 2023-06-06 ENCOUNTER — Other Ambulatory Visit: Payer: Self-pay

## 2023-06-17 ENCOUNTER — Other Ambulatory Visit: Payer: Self-pay

## 2023-07-01 ENCOUNTER — Other Ambulatory Visit: Payer: Self-pay

## 2023-07-24 DIAGNOSIS — N184 Chronic kidney disease, stage 4 (severe): Secondary | ICD-10-CM | POA: Diagnosis not present

## 2023-07-24 DIAGNOSIS — N051 Unspecified nephritic syndrome with focal and segmental glomerular lesions: Secondary | ICD-10-CM | POA: Diagnosis not present

## 2023-07-24 DIAGNOSIS — I10 Essential (primary) hypertension: Secondary | ICD-10-CM | POA: Diagnosis not present

## 2023-07-24 DIAGNOSIS — D631 Anemia in chronic kidney disease: Secondary | ICD-10-CM | POA: Diagnosis not present

## 2023-09-12 DIAGNOSIS — Z01818 Encounter for other preprocedural examination: Secondary | ICD-10-CM | POA: Diagnosis not present

## 2023-09-12 DIAGNOSIS — N051 Unspecified nephritic syndrome with focal and segmental glomerular lesions: Secondary | ICD-10-CM | POA: Diagnosis not present

## 2023-09-12 LAB — LAB REPORT - SCANNED
A1c: 5.3
EGFR: 22
HM HIV Screening: NEGATIVE
PSA, Total: 0.54
PTH: 295

## 2023-09-18 DIAGNOSIS — I10 Essential (primary) hypertension: Secondary | ICD-10-CM | POA: Diagnosis not present

## 2023-09-18 DIAGNOSIS — Z01810 Encounter for preprocedural cardiovascular examination: Secondary | ICD-10-CM | POA: Diagnosis not present

## 2023-09-18 DIAGNOSIS — N051 Unspecified nephritic syndrome with focal and segmental glomerular lesions: Secondary | ICD-10-CM | POA: Diagnosis not present

## 2023-09-18 DIAGNOSIS — R001 Bradycardia, unspecified: Secondary | ICD-10-CM | POA: Diagnosis not present

## 2023-09-18 DIAGNOSIS — Z01818 Encounter for other preprocedural examination: Secondary | ICD-10-CM | POA: Diagnosis not present

## 2023-09-18 DIAGNOSIS — N2889 Other specified disorders of kidney and ureter: Secondary | ICD-10-CM | POA: Diagnosis not present

## 2023-09-24 DIAGNOSIS — N051 Unspecified nephritic syndrome with focal and segmental glomerular lesions: Secondary | ICD-10-CM | POA: Diagnosis not present

## 2023-09-24 DIAGNOSIS — Z01818 Encounter for other preprocedural examination: Secondary | ICD-10-CM | POA: Diagnosis not present

## 2023-10-07 DIAGNOSIS — Z01818 Encounter for other preprocedural examination: Secondary | ICD-10-CM | POA: Diagnosis not present

## 2023-10-16 ENCOUNTER — Other Ambulatory Visit (HOSPITAL_BASED_OUTPATIENT_CLINIC_OR_DEPARTMENT_OTHER): Payer: Self-pay

## 2023-11-27 ENCOUNTER — Other Ambulatory Visit: Payer: Self-pay | Admitting: Family Medicine

## 2023-11-27 ENCOUNTER — Other Ambulatory Visit (HOSPITAL_BASED_OUTPATIENT_CLINIC_OR_DEPARTMENT_OTHER): Payer: Self-pay

## 2023-11-27 MED ORDER — DAPAGLIFLOZIN PROPANEDIOL 10 MG PO TABS
10.0000 mg | ORAL_TABLET | Freq: Every day | ORAL | 3 refills | Status: AC
  Filled 2023-11-27: qty 90, 90d supply, fill #0
  Filled 2024-01-13: qty 30, 30d supply, fill #0
  Filled 2024-01-13: qty 90, 90d supply, fill #0
  Filled 2024-03-01: qty 30, 30d supply, fill #1
  Filled 2024-04-23: qty 30, 30d supply, fill #2

## 2023-11-28 ENCOUNTER — Other Ambulatory Visit (HOSPITAL_BASED_OUTPATIENT_CLINIC_OR_DEPARTMENT_OTHER): Payer: Self-pay

## 2023-11-28 MED ORDER — VALSARTAN 80 MG PO TABS
80.0000 mg | ORAL_TABLET | Freq: Every day | ORAL | 0 refills | Status: DC
  Filled 2023-11-28 – 2024-01-13 (×2): qty 30, 30d supply, fill #0

## 2023-12-09 ENCOUNTER — Other Ambulatory Visit (HOSPITAL_BASED_OUTPATIENT_CLINIC_OR_DEPARTMENT_OTHER): Payer: Self-pay

## 2023-12-10 DIAGNOSIS — N182 Chronic kidney disease, stage 2 (mild): Secondary | ICD-10-CM | POA: Diagnosis not present

## 2023-12-10 DIAGNOSIS — N184 Chronic kidney disease, stage 4 (severe): Secondary | ICD-10-CM | POA: Diagnosis not present

## 2023-12-13 DIAGNOSIS — N184 Chronic kidney disease, stage 4 (severe): Secondary | ICD-10-CM | POA: Diagnosis not present

## 2023-12-13 DIAGNOSIS — N051 Unspecified nephritic syndrome with focal and segmental glomerular lesions: Secondary | ICD-10-CM | POA: Diagnosis not present

## 2023-12-13 DIAGNOSIS — I10 Essential (primary) hypertension: Secondary | ICD-10-CM | POA: Diagnosis not present

## 2023-12-13 DIAGNOSIS — D631 Anemia in chronic kidney disease: Secondary | ICD-10-CM | POA: Diagnosis not present

## 2023-12-19 DIAGNOSIS — Z01818 Encounter for other preprocedural examination: Secondary | ICD-10-CM | POA: Diagnosis not present

## 2023-12-19 DIAGNOSIS — N186 End stage renal disease: Secondary | ICD-10-CM | POA: Diagnosis not present

## 2023-12-19 DIAGNOSIS — N051 Unspecified nephritic syndrome with focal and segmental glomerular lesions: Secondary | ICD-10-CM | POA: Diagnosis not present

## 2023-12-19 DIAGNOSIS — N041 Nephrotic syndrome with focal and segmental glomerular lesions: Secondary | ICD-10-CM | POA: Diagnosis not present

## 2023-12-19 DIAGNOSIS — I151 Hypertension secondary to other renal disorders: Secondary | ICD-10-CM | POA: Diagnosis not present

## 2023-12-19 DIAGNOSIS — D849 Immunodeficiency, unspecified: Secondary | ICD-10-CM | POA: Diagnosis not present

## 2024-01-13 ENCOUNTER — Other Ambulatory Visit (HOSPITAL_BASED_OUTPATIENT_CLINIC_OR_DEPARTMENT_OTHER): Payer: Self-pay

## 2024-01-28 DIAGNOSIS — Z01818 Encounter for other preprocedural examination: Secondary | ICD-10-CM | POA: Diagnosis not present

## 2024-01-28 DIAGNOSIS — Z7682 Awaiting organ transplant status: Secondary | ICD-10-CM | POA: Diagnosis not present

## 2024-01-28 DIAGNOSIS — N184 Chronic kidney disease, stage 4 (severe): Secondary | ICD-10-CM | POA: Diagnosis not present

## 2024-01-28 DIAGNOSIS — Z01810 Encounter for preprocedural cardiovascular examination: Secondary | ICD-10-CM | POA: Diagnosis not present

## 2024-01-28 DIAGNOSIS — Z23 Encounter for immunization: Secondary | ICD-10-CM | POA: Diagnosis not present

## 2024-01-28 DIAGNOSIS — I151 Hypertension secondary to other renal disorders: Secondary | ICD-10-CM | POA: Diagnosis not present

## 2024-01-28 DIAGNOSIS — N186 End stage renal disease: Secondary | ICD-10-CM | POA: Diagnosis not present

## 2024-01-29 NOTE — Progress Notes (Signed)
 Stress and ECHO acceptable, good exercise tolerance.

## 2024-02-17 DIAGNOSIS — M9903 Segmental and somatic dysfunction of lumbar region: Secondary | ICD-10-CM | POA: Diagnosis not present

## 2024-02-17 DIAGNOSIS — M9901 Segmental and somatic dysfunction of cervical region: Secondary | ICD-10-CM | POA: Diagnosis not present

## 2024-02-17 DIAGNOSIS — M9905 Segmental and somatic dysfunction of pelvic region: Secondary | ICD-10-CM | POA: Diagnosis not present

## 2024-02-17 DIAGNOSIS — M9902 Segmental and somatic dysfunction of thoracic region: Secondary | ICD-10-CM | POA: Diagnosis not present

## 2024-02-17 DIAGNOSIS — G243 Spasmodic torticollis: Secondary | ICD-10-CM | POA: Diagnosis not present

## 2024-02-17 DIAGNOSIS — M5386 Other specified dorsopathies, lumbar region: Secondary | ICD-10-CM | POA: Diagnosis not present

## 2024-02-19 DIAGNOSIS — M9902 Segmental and somatic dysfunction of thoracic region: Secondary | ICD-10-CM | POA: Diagnosis not present

## 2024-02-19 DIAGNOSIS — G243 Spasmodic torticollis: Secondary | ICD-10-CM | POA: Diagnosis not present

## 2024-02-19 DIAGNOSIS — M9903 Segmental and somatic dysfunction of lumbar region: Secondary | ICD-10-CM | POA: Diagnosis not present

## 2024-02-19 DIAGNOSIS — M5386 Other specified dorsopathies, lumbar region: Secondary | ICD-10-CM | POA: Diagnosis not present

## 2024-02-19 DIAGNOSIS — M9905 Segmental and somatic dysfunction of pelvic region: Secondary | ICD-10-CM | POA: Diagnosis not present

## 2024-02-19 DIAGNOSIS — M9901 Segmental and somatic dysfunction of cervical region: Secondary | ICD-10-CM | POA: Diagnosis not present

## 2024-02-24 DIAGNOSIS — M5386 Other specified dorsopathies, lumbar region: Secondary | ICD-10-CM | POA: Diagnosis not present

## 2024-02-24 DIAGNOSIS — G243 Spasmodic torticollis: Secondary | ICD-10-CM | POA: Diagnosis not present

## 2024-02-24 DIAGNOSIS — M9902 Segmental and somatic dysfunction of thoracic region: Secondary | ICD-10-CM | POA: Diagnosis not present

## 2024-02-24 DIAGNOSIS — M9905 Segmental and somatic dysfunction of pelvic region: Secondary | ICD-10-CM | POA: Diagnosis not present

## 2024-02-24 DIAGNOSIS — M9901 Segmental and somatic dysfunction of cervical region: Secondary | ICD-10-CM | POA: Diagnosis not present

## 2024-02-24 DIAGNOSIS — M9903 Segmental and somatic dysfunction of lumbar region: Secondary | ICD-10-CM | POA: Diagnosis not present

## 2024-02-24 DIAGNOSIS — M79674 Pain in right toe(s): Secondary | ICD-10-CM | POA: Diagnosis not present

## 2024-03-01 ENCOUNTER — Other Ambulatory Visit: Payer: Self-pay | Admitting: Family Medicine

## 2024-03-02 ENCOUNTER — Other Ambulatory Visit (HOSPITAL_BASED_OUTPATIENT_CLINIC_OR_DEPARTMENT_OTHER): Payer: Self-pay

## 2024-03-02 DIAGNOSIS — N051 Unspecified nephritic syndrome with focal and segmental glomerular lesions: Secondary | ICD-10-CM | POA: Diagnosis not present

## 2024-03-02 DIAGNOSIS — N184 Chronic kidney disease, stage 4 (severe): Secondary | ICD-10-CM | POA: Diagnosis not present

## 2024-03-02 DIAGNOSIS — M5386 Other specified dorsopathies, lumbar region: Secondary | ICD-10-CM | POA: Diagnosis not present

## 2024-03-02 DIAGNOSIS — M9905 Segmental and somatic dysfunction of pelvic region: Secondary | ICD-10-CM | POA: Diagnosis not present

## 2024-03-02 DIAGNOSIS — G243 Spasmodic torticollis: Secondary | ICD-10-CM | POA: Diagnosis not present

## 2024-03-02 DIAGNOSIS — D631 Anemia in chronic kidney disease: Secondary | ICD-10-CM | POA: Diagnosis not present

## 2024-03-02 DIAGNOSIS — M9903 Segmental and somatic dysfunction of lumbar region: Secondary | ICD-10-CM | POA: Diagnosis not present

## 2024-03-02 DIAGNOSIS — M9901 Segmental and somatic dysfunction of cervical region: Secondary | ICD-10-CM | POA: Diagnosis not present

## 2024-03-02 DIAGNOSIS — M9902 Segmental and somatic dysfunction of thoracic region: Secondary | ICD-10-CM | POA: Diagnosis not present

## 2024-03-02 LAB — IRON,TIBC AND FERRITIN PANEL
%SAT: 22
Ferritin: 50
Iron: 64
TIBC: 289
UIBC: 225

## 2024-03-02 LAB — COMPREHENSIVE METABOLIC PANEL WITH GFR
Albumin: 4.2 (ref 3.5–5.0)
Calcium: 8.8 (ref 8.7–10.7)
eGFR: 21

## 2024-03-02 LAB — BASIC METABOLIC PANEL WITH GFR
BUN: 44 — AB (ref 4–21)
CO2: 22 (ref 13–22)
Chloride: 106 (ref 99–108)
Creatinine: 3.7 — AB (ref 0.6–1.3)
Glucose: 83
Potassium: 4.7 meq/L (ref 3.5–5.1)
Sodium: 142 (ref 137–147)

## 2024-03-02 LAB — CBC AND DIFFERENTIAL: Hemoglobin: 14.4 (ref 13.5–17.5)

## 2024-03-02 LAB — VITAMIN D 25 HYDROXY (VIT D DEFICIENCY, FRACTURES): Vit D, 25-Hydroxy: 21.9

## 2024-03-02 LAB — PROTEIN / CREATININE RATIO, URINE: Creatinine, Urine: 34.9

## 2024-03-02 MED ORDER — VALSARTAN 80 MG PO TABS
80.0000 mg | ORAL_TABLET | Freq: Every day | ORAL | 0 refills | Status: DC
  Filled 2024-03-02: qty 30, 30d supply, fill #0

## 2024-03-06 ENCOUNTER — Other Ambulatory Visit (HOSPITAL_BASED_OUTPATIENT_CLINIC_OR_DEPARTMENT_OTHER): Payer: Self-pay

## 2024-03-06 ENCOUNTER — Encounter: Payer: Self-pay | Admitting: Family Medicine

## 2024-03-06 ENCOUNTER — Ambulatory Visit (INDEPENDENT_AMBULATORY_CARE_PROVIDER_SITE_OTHER): Admitting: Family Medicine

## 2024-03-06 VITALS — BP 112/74 | HR 91 | Temp 97.7°F | Resp 16 | Ht 65.0 in | Wt 189.2 lb

## 2024-03-06 DIAGNOSIS — Z3009 Encounter for other general counseling and advice on contraception: Secondary | ICD-10-CM

## 2024-03-06 DIAGNOSIS — Z0000 Encounter for general adult medical examination without abnormal findings: Secondary | ICD-10-CM | POA: Diagnosis not present

## 2024-03-06 MED ORDER — VALSARTAN 80 MG PO TABS: 80.0000 mg | ORAL_TABLET | Freq: Every day | ORAL | Status: DC

## 2024-03-06 NOTE — Progress Notes (Signed)
 Chief Complaint  Patient presents with   Annual Exam    CPE    Well Male William Long is here for a complete physical.   His last physical was >1 year ago.  Current diet: in general, a healthy diet.   Current exercise: cycling Weight trend: stable Fatigue out of ordinary? No. Seat belt? Yes.   Advanced directive? No  Health maintenance Tetanus- Yes HIV- Yes Hep C- Yes  Past Medical History:  Diagnosis Date   CKD (chronic kidney disease) stage 3, GFR 30-59 ml/min (HCC)    Hypertension      Past Surgical History:  Procedure Laterality Date   NO PAST SURGERIES      Medications  Current Outpatient Medications on File Prior to Visit  Medication Sig Dispense Refill   dapagliflozin  propanediol (FARXIGA ) 10 MG TABS tablet Take 1 tablet (10 mg total) by mouth daily. 90 tablet 3   valsartan  (DIOVAN ) 80 MG tablet Take 1 tablet (80 mg total) by mouth daily. Needs appointment. 30 tablet 0    Allergies No Known Allergies  Family History Family History  Problem Relation Age of Onset   Diabetes Maternal Grandfather     Review of Systems: Constitutional: no fevers or chills Eye:  no recent significant change in vision Ear/Nose/Mouth/Throat:  Ears:  no hearing loss Nose/Mouth/Throat:  no complaints of nasal congestion, no sore throat Cardiovascular:  no chest pain Respiratory:  no shortness of breath Gastrointestinal:  no abdominal pain, no change in bowel habits GU:  Male: negative for dysuria Musculoskeletal/Extremities:  no pain of the joints Integumentary (Skin/Breast):  no abnormal skin lesions reported Neurologic:  no headaches Endocrine: No unexpected weight changes Hematologic/Lymphatic:  no night sweats  Exam BP 112/74 (BP Location: Left Arm, Patient Position: Sitting)   Pulse 91   Temp 97.7 F (36.5 C) (Oral)   Resp 16   Ht 5' 5 (1.651 m)   Wt 189 lb 3.2 oz (85.8 kg)   SpO2 98%   BMI 31.48 kg/m  General:  well developed, well nourished, in no  apparent distress Skin:  no significant moles, warts, or growths Head:  no masses, lesions, or tenderness Eyes:  pupils equal and round, sclera anicteric without injection Ears:  canals without lesions, TMs shiny without retraction, no obvious effusion, no erythema Nose:  nares patent, mucosa normal Throat/Pharynx:  lips and gingiva without lesion; tongue and uvula midline; non-inflamed pharynx; no exudates or postnasal drainage Neck: neck supple without adenopathy, thyromegaly, or masses Lungs:  clear to auscultation, breath sounds equal bilaterally, no respiratory distress Cardio:  regular rate and rhythm, no bruits, no LE edema Abdomen:  abdomen soft, nontender; bowel sounds normal; no masses or organomegaly Genital (male): Deferred Rectal: Deferred Musculoskeletal:  symmetrical muscle groups noted without atrophy or deformity Extremities:  no clubbing, cyanosis, or edema, no deformities, no skin discoloration Neuro:  gait normal; deep tendon reflexes normal and symmetric Psych: well oriented with normal range of affect and appropriate judgment/insight  Assessment and Plan  Well adult exam - Plan: CBC with Differential/Platelet, Comprehensive metabolic panel with GFR, Lipid panel, Lipid panel  Well 38 y.o. male. Counseled on diet and exercise. Self testicular exams recommended at least monthly.  Advanced directive form provided today.  Other orders as above. Follow up in 6 mo pending the above workup. The patient voiced understanding and agreement to the plan.  Mabel [place], DO 03/06/24 4:11 PM

## 2024-03-06 NOTE — Patient Instructions (Signed)
 Give [place] 2-3 business days to get the results of your labs back.   Keep the diet clean and stay active.  Do monthly self testicular checks in the shower. You are feeling for lumps/bumps that don't belong. If you feel anything like this, let me know!  Please get me a copy of your advanced directive form at your convenience.   Let us know if you need anything.

## 2024-03-07 ENCOUNTER — Ambulatory Visit: Payer: Self-pay | Admitting: Family Medicine

## 2024-03-07 LAB — CBC WITH DIFFERENTIAL/PLATELET
Absolute Lymphocytes: 1785 {cells}/uL (ref 850–3900)
Absolute Monocytes: 442 {cells}/uL (ref 200–950)
Basophils Absolute: 63 {cells}/uL (ref 0–200)
Basophils Relative: 0.8 %
Eosinophils Absolute: 237 {cells}/uL (ref 15–500)
Eosinophils Relative: 3 %
HCT: 43.5 % (ref 38.5–50.0)
Hemoglobin: 14.3 g/dL (ref 13.2–17.1)
MCH: 28.1 pg (ref 27.0–33.0)
MCHC: 32.9 g/dL (ref 32.0–36.0)
MCV: 85.5 fL (ref 80.0–100.0)
MPV: 10.4 fL (ref 7.5–12.5)
Monocytes Relative: 5.6 %
Neutro Abs: 5372 {cells}/uL (ref 1500–7800)
Neutrophils Relative %: 68 %
Platelets: 287 Thousand/uL (ref 140–400)
RBC: 5.09 Million/uL (ref 4.20–5.80)
RDW: 13.6 % (ref 11.0–15.0)
Total Lymphocyte: 22.6 %
WBC: 7.9 Thousand/uL (ref 3.8–10.8)

## 2024-03-07 LAB — COMPREHENSIVE METABOLIC PANEL WITH GFR
AG Ratio: 1.6 (calc) (ref 1.0–2.5)
ALT: 16 U/L (ref 9–46)
AST: 18 U/L (ref 10–40)
Albumin: 4.2 g/dL (ref 3.6–5.1)
Alkaline phosphatase (APISO): 45 U/L (ref 36–130)
BUN/Creatinine Ratio: 15 (calc) (ref 6–22)
BUN: 61 mg/dL — ABNORMAL HIGH (ref 7–25)
CO2: 23 mmol/L (ref 20–32)
Calcium: 8.9 mg/dL (ref 8.6–10.3)
Chloride: 105 mmol/L (ref 98–110)
Creat: 4.11 mg/dL — ABNORMAL HIGH (ref 0.60–1.26)
Globulin: 2.6 g/dL (ref 1.9–3.7)
Glucose, Bld: 96 mg/dL (ref 65–99)
Potassium: 4.8 mmol/L (ref 3.5–5.3)
Sodium: 139 mmol/L (ref 135–146)
Total Bilirubin: 0.3 mg/dL (ref 0.2–1.2)
Total Protein: 6.8 g/dL (ref 6.1–8.1)
eGFR: 18 mL/min/1.73m2 — ABNORMAL LOW (ref >=60)

## 2024-03-07 LAB — LIPID PANEL
Cholesterol: 251 mg/dL — ABNORMAL HIGH (ref <200)
HDL: 48 mg/dL (ref >=40)
LDL Cholesterol (Calc): 167 mg/dL — ABNORMAL HIGH
Non-HDL Cholesterol (Calc): 203 mg/dL — ABNORMAL HIGH (ref <130)
Total CHOL/HDL Ratio: 5.2 (calc) — ABNORMAL HIGH (ref <5.0)
Triglycerides: 197 mg/dL — ABNORMAL HIGH (ref <150)

## 2024-03-10 ENCOUNTER — Other Ambulatory Visit: Payer: Self-pay

## 2024-03-10 DIAGNOSIS — E781 Pure hyperglyceridemia: Secondary | ICD-10-CM

## 2024-03-20 ENCOUNTER — Encounter: Payer: Self-pay | Admitting: Family Medicine

## 2024-03-26 DIAGNOSIS — Z01818 Encounter for other preprocedural examination: Secondary | ICD-10-CM | POA: Diagnosis not present

## 2024-03-26 DIAGNOSIS — Z23 Encounter for immunization: Secondary | ICD-10-CM | POA: Diagnosis not present

## 2024-03-26 DIAGNOSIS — N184 Chronic kidney disease, stage 4 (severe): Secondary | ICD-10-CM | POA: Diagnosis not present

## 2024-04-21 NOTE — Progress Notes (Unsigned)
" ° ° °  Chief Complaint: Vasectomy consultation  History of Present Illness:     Past Medical History:  Past Medical History:  Diagnosis Date   CKD (chronic kidney disease) stage 3, GFR 30-59 ml/min (HCC)    Hypertension     Past Surgical History:  Past Surgical History:  Procedure Laterality Date   NO PAST SURGERIES      Allergies:  Allergies[1]  Family History:  Family History  Problem Relation Age of Onset   Diabetes Maternal Grandfather     Social History:  Social History[2]  Review of symptoms:  Constitutional:  Negative for unexplained weight loss, night sweats, fever, chills ENT:  Negative for nose bleeds, sinus pain, painful swallowing CV:  Negative for chest pain, shortness of breath, exercise intolerance, palpitations, loss of consciousness Resp:  Negative for cough, wheezing, shortness of breath GI:  Negative for nausea, vomiting, diarrhea, bloody stools GU:  Positives noted in HPI; otherwise negative for gross hematuria, dysuria, urinary incontinence Neuro:  Negative for seizures, poor balance, limb weakness, slurred speech Psych:  Negative for lack of energy, depression, anxiety Endocrine:  Negative for polydipsia, polyuria, symptoms of hypoglycemia (dizziness, hunger, sweating) Hematologic:  Negative for anemia, purpura, petechia, prolonged or excessive bleeding, use of anticoagulants  Allergic:  Negative for difficulty breathing or choking as a result of exposure to anything; no shellfish allergy; no allergic response (rash/itch) to materials, foods  Physical exam: There were no vitals taken for this visit. GENERAL APPEARANCE:  Well appearing, well developed, well nourished, NAD HEENT: Atraumatic, Normocephalic. NECK: Normal appearance LUNGS: Normal inspiratory and expiratory excursion HEART: Regular Rate ABDOMEN: ***. GU: Phallus normal, no lesions. Scrotal skin normal. Testicles/epididymal structures normal. Meatus normal. Normal anal sphincter  tone, prostate ***mL, symmetric, non nodular, non tender. EXTREMITIES: Moves all extremities well.  Without clubbing, cyanosis, or edema. NEUROLOGIC:  Alert and oriented x 3, normal gait, CN II-XII grossly intact.  MENTAL STATUS:  Appropriate. SKIN:  Warm, dry and intact.    Results: No results found for this or any previous visit (from the past 24 hours).  I have reviewed referring/prior physicians notes  I have reviewed urinalysis  I have reviewed PSA results  I have reviewed prior imaging  I have reviewed urine culture results  Assessment: ***   Plan: ***     [1] No Known Allergies [2]  Social History Tobacco Use   Smoking status: Former   Smokeless tobacco: Former  Building Services Engineer status: Never Used  Substance Use Topics   Alcohol use: Not Currently   Drug use: Not Currently   "

## 2024-04-22 ENCOUNTER — Ambulatory Visit: Admitting: Urology

## 2024-04-22 VITALS — BP 133/87 | HR 68 | Ht 65.0 in | Wt 192.0 lb

## 2024-04-22 DIAGNOSIS — Z3009 Encounter for other general counseling and advice on contraception: Secondary | ICD-10-CM

## 2024-04-23 ENCOUNTER — Other Ambulatory Visit (HOSPITAL_BASED_OUTPATIENT_CLINIC_OR_DEPARTMENT_OTHER): Payer: Self-pay

## 2024-04-23 ENCOUNTER — Other Ambulatory Visit: Payer: Self-pay | Admitting: Family Medicine

## 2024-04-23 ENCOUNTER — Other Ambulatory Visit: Payer: Self-pay

## 2024-04-23 MED ORDER — VALSARTAN 80 MG PO TABS
80.0000 mg | ORAL_TABLET | Freq: Every day | ORAL | 1 refills | Status: AC
  Filled 2024-04-23: qty 90, 90d supply, fill #0

## 2024-04-27 ENCOUNTER — Ambulatory Visit: Payer: Self-pay | Admitting: Family Medicine

## 2024-04-27 ENCOUNTER — Other Ambulatory Visit (INDEPENDENT_AMBULATORY_CARE_PROVIDER_SITE_OTHER)

## 2024-04-27 DIAGNOSIS — E781 Pure hyperglyceridemia: Secondary | ICD-10-CM | POA: Diagnosis not present

## 2024-04-27 LAB — LIPID PANEL
Cholesterol: 226 mg/dL — ABNORMAL HIGH (ref 28–200)
HDL: 41.8 mg/dL
LDL Cholesterol: 157 mg/dL — ABNORMAL HIGH (ref 10–99)
NonHDL: 184.32
Total CHOL/HDL Ratio: 5
Triglycerides: 135 mg/dL (ref 10.0–149.0)
VLDL: 27 mg/dL (ref 0.0–40.0)

## 2024-06-01 ENCOUNTER — Encounter: Admitting: Urology
